# Patient Record
Sex: Female | Born: 1955
Health system: Southern US, Community
[De-identification: ages and names within clinical notes are randomized; demographics above are authoritative.]

## PROBLEM LIST (undated history)

## (undated) DIAGNOSIS — R519 Headache, unspecified: Secondary | ICD-10-CM

## (undated) DIAGNOSIS — R51 Headache: Secondary | ICD-10-CM

## (undated) DIAGNOSIS — Z801 Family history of malignant neoplasm of trachea, bronchus and lung: Secondary | ICD-10-CM

## (undated) DIAGNOSIS — Z803 Family history of malignant neoplasm of breast: Secondary | ICD-10-CM

## (undated) DIAGNOSIS — K219 Gastro-esophageal reflux disease without esophagitis: Secondary | ICD-10-CM

## (undated) DIAGNOSIS — Z8049 Family history of malignant neoplasm of other genital organs: Secondary | ICD-10-CM

## (undated) DIAGNOSIS — I10 Essential (primary) hypertension: Secondary | ICD-10-CM

## (undated) DIAGNOSIS — C801 Malignant (primary) neoplasm, unspecified: Secondary | ICD-10-CM

## (undated) HISTORY — DX: Essential (primary) hypertension: I10

## (undated) HISTORY — DX: Headache, unspecified: R51.9

## (undated) HISTORY — DX: Family history of malignant neoplasm of trachea, bronchus and lung: Z80.1

## (undated) HISTORY — DX: Family history of malignant neoplasm of breast: Z80.3

## (undated) HISTORY — DX: Malignant (primary) neoplasm, unspecified: C80.1

## (undated) HISTORY — DX: Gastro-esophageal reflux disease without esophagitis: K21.9

## (undated) HISTORY — DX: Headache: R51

## (undated) HISTORY — DX: Family history of malignant neoplasm of other genital organs: Z80.49

---

## 1999-06-28 ENCOUNTER — Other Ambulatory Visit: Admission: RE | Admit: 1999-06-28 | Discharge: 1999-06-28 | Payer: Self-pay | Admitting: *Deleted

## 2000-07-15 ENCOUNTER — Encounter: Admission: RE | Admit: 2000-07-15 | Discharge: 2000-07-15 | Payer: Self-pay | Admitting: *Deleted

## 2001-07-22 ENCOUNTER — Encounter: Admission: RE | Admit: 2001-07-22 | Discharge: 2001-07-22 | Payer: Self-pay | Admitting: Internal Medicine

## 2002-08-12 ENCOUNTER — Encounter: Admission: RE | Admit: 2002-08-12 | Discharge: 2002-08-12 | Payer: Self-pay | Admitting: *Deleted

## 2002-08-18 ENCOUNTER — Encounter: Admission: RE | Admit: 2002-08-18 | Discharge: 2002-08-18 | Payer: Self-pay | Admitting: *Deleted

## 2003-02-10 ENCOUNTER — Encounter: Admission: RE | Admit: 2003-02-10 | Discharge: 2003-02-10 | Payer: Self-pay | Admitting: *Deleted

## 2003-03-17 ENCOUNTER — Other Ambulatory Visit: Admission: RE | Admit: 2003-03-17 | Discharge: 2003-03-17 | Payer: Self-pay | Admitting: *Deleted

## 2003-08-22 ENCOUNTER — Encounter: Admission: RE | Admit: 2003-08-22 | Discharge: 2003-08-22 | Payer: Self-pay | Admitting: *Deleted

## 2004-04-09 ENCOUNTER — Other Ambulatory Visit: Admission: RE | Admit: 2004-04-09 | Discharge: 2004-04-09 | Payer: Self-pay | Admitting: *Deleted

## 2005-12-17 ENCOUNTER — Encounter: Admission: RE | Admit: 2005-12-17 | Discharge: 2005-12-17 | Payer: Self-pay | Admitting: Family Medicine

## 2007-02-19 ENCOUNTER — Encounter: Admission: RE | Admit: 2007-02-19 | Discharge: 2007-02-19 | Payer: Self-pay | Admitting: Family Medicine

## 2011-02-28 ENCOUNTER — Other Ambulatory Visit: Payer: Self-pay | Admitting: Family Medicine

## 2011-02-28 DIAGNOSIS — E041 Nontoxic single thyroid nodule: Secondary | ICD-10-CM

## 2011-03-01 ENCOUNTER — Ambulatory Visit
Admission: RE | Admit: 2011-03-01 | Discharge: 2011-03-01 | Disposition: A | Discharge status: Home / Self Care | Payer: BC Managed Care – PPO | Source: Ambulatory Visit | Attending: Family Medicine | Admitting: Family Medicine

## 2011-03-01 DIAGNOSIS — E041 Nontoxic single thyroid nodule: Secondary | ICD-10-CM

## 2011-03-04 ENCOUNTER — Other Ambulatory Visit: Payer: Self-pay | Admitting: Family Medicine

## 2011-03-04 DIAGNOSIS — E041 Nontoxic single thyroid nodule: Secondary | ICD-10-CM

## 2011-04-17 ENCOUNTER — Other Ambulatory Visit: Payer: Self-pay | Admitting: Endocrinology

## 2011-04-17 DIAGNOSIS — E042 Nontoxic multinodular goiter: Secondary | ICD-10-CM

## 2011-08-13 ENCOUNTER — Other Ambulatory Visit: Payer: BC Managed Care – PPO

## 2011-08-21 ENCOUNTER — Ambulatory Visit
Admission: RE | Admit: 2011-08-21 | Discharge: 2011-08-21 | Disposition: A | Discharge status: Home / Self Care | Payer: BC Managed Care – PPO | Source: Ambulatory Visit | Attending: Endocrinology | Admitting: Endocrinology

## 2011-08-21 DIAGNOSIS — E042 Nontoxic multinodular goiter: Secondary | ICD-10-CM

## 2014-01-12 ENCOUNTER — Other Ambulatory Visit: Payer: Self-pay | Admitting: Family Medicine

## 2014-01-12 ENCOUNTER — Other Ambulatory Visit (HOSPITAL_COMMUNITY)
Admission: RE | Admit: 2014-01-12 | Discharge: 2014-01-12 | Disposition: A | Discharge status: Home / Self Care | Payer: BC Managed Care – PPO | Source: Ambulatory Visit | Attending: Family Medicine | Admitting: Family Medicine

## 2014-01-12 DIAGNOSIS — Z1151 Encounter for screening for human papillomavirus (HPV): Secondary | ICD-10-CM | POA: Insufficient documentation

## 2014-01-12 DIAGNOSIS — Z124 Encounter for screening for malignant neoplasm of cervix: Secondary | ICD-10-CM | POA: Insufficient documentation

## 2014-01-12 DIAGNOSIS — E041 Nontoxic single thyroid nodule: Secondary | ICD-10-CM

## 2014-01-26 ENCOUNTER — Ambulatory Visit
Admission: RE | Admit: 2014-01-26 | Discharge: 2014-01-26 | Disposition: A | Discharge status: Home / Self Care | Payer: BC Managed Care – PPO | Source: Ambulatory Visit | Attending: Family Medicine | Admitting: Family Medicine

## 2014-01-26 DIAGNOSIS — E041 Nontoxic single thyroid nodule: Secondary | ICD-10-CM

## 2015-08-30 ENCOUNTER — Other Ambulatory Visit: Payer: Self-pay | Admitting: Family Medicine

## 2015-08-30 DIAGNOSIS — R109 Unspecified abdominal pain: Secondary | ICD-10-CM

## 2015-08-31 ENCOUNTER — Ambulatory Visit
Admission: RE | Admit: 2015-08-31 | Discharge: 2015-08-31 | Disposition: A | Discharge status: Home / Self Care | Payer: BLUE CROSS/BLUE SHIELD | Source: Ambulatory Visit | Attending: Family Medicine | Admitting: Family Medicine

## 2015-08-31 DIAGNOSIS — R109 Unspecified abdominal pain: Secondary | ICD-10-CM

## 2015-11-16 ENCOUNTER — Other Ambulatory Visit: Payer: Self-pay | Admitting: Family Medicine

## 2015-11-16 DIAGNOSIS — R102 Pelvic and perineal pain: Secondary | ICD-10-CM

## 2015-11-20 ENCOUNTER — Ambulatory Visit
Admission: RE | Admit: 2015-11-20 | Discharge: 2015-11-20 | Disposition: A | Discharge status: Home / Self Care | Payer: Managed Care, Other (non HMO) | Source: Ambulatory Visit | Attending: Family Medicine | Admitting: Family Medicine

## 2015-11-20 DIAGNOSIS — R102 Pelvic and perineal pain: Secondary | ICD-10-CM

## 2015-11-20 MED ORDER — GADOBENATE DIMEGLUMINE 529 MG/ML IV SOLN
15.0000 mL | Freq: Once | INTRAVENOUS | Status: AC | PRN
  Administered 2015-11-20: 15 mL via INTRAVENOUS

## 2016-09-24 ENCOUNTER — Ambulatory Visit (INDEPENDENT_AMBULATORY_CARE_PROVIDER_SITE_OTHER): Payer: BLUE CROSS/BLUE SHIELD | Admitting: Sports Medicine

## 2016-09-24 DIAGNOSIS — M2170 Unequal limb length (acquired), unspecified site: Secondary | ICD-10-CM

## 2016-09-24 DIAGNOSIS — M722 Plantar fascial fibromatosis: Secondary | ICD-10-CM

## 2016-09-24 DIAGNOSIS — M25579 Pain in unspecified ankle and joints of unspecified foot: Secondary | ICD-10-CM | POA: Diagnosis not present

## 2016-09-24 NOTE — Progress Notes (Signed)
   Subjective:  Patient ID: Angel Gaines, female    DOB: August 08, 1956  Age: 60 y.o. MRN: [id number]  CC: Bilateral Foot Pain  HPI Angel Gaines is a 60 year old female with a PMH significant for plantar fasciitis, who presents today with bilateral foot pain. She has had a history of plantar fasciitis for over two years now. She got rigid orthotics to help with her plantar fasciitis and vionic sandals. She reports that she wears the vionic sandals most of the year and only wears her insert with sneakers when she needs to walk and during the winter time. Patient reports that right now, while both feet are bothering her, patient reports that her left foot pain is much worse than her right. She reports that the pain is worse in the morning and gets better throughout the day, but is worsened with activity. Patient denies swelling or numbness.   Exercise pattern - likes to walk 3 miles 4 x per week Also walks dog  Past Med Hx - no active problems  ROS Review of Systems  Musculoskeletal:       Bilateral Foot Pain  Refer to HPI for pertinent negatives and positives   Objective:  Today's Vitals: BP (!) 142/85   Ht 5\' 3"  (1.6 m)   Wt 160 lb (72.6 kg)   BMI 28.34 kg/m   Physical Exam  Constitutional: She appears well-developed and well-nourished.  Musculoskeletal:  Right Foot: No lesions, deformities, or swelling. Mild loss of longitudinal arch. No point tenderness. Normal ROM. Neurovascularly intact.  Left Foot:  Resting pronation. Piezogenic papules present on medial heel. Fallen arch. Calcaneal valgus. 2-4 MTP shifted laterally.  Normal ROM. Neurovascularly intact.  Gait: Pronation of the left foot when walking w/o orthotics. 1 cm leg length discrepancy w/ left leg shorter.     Assessment & Plan:   1. Bilateral Foot Pain Secondary to Plantar Fasciitis Patient is currently using rigid orthotics, which corrects her pronation in her left foot, but expresses discomfort. Recommend she  tried cushioned orthotics. Provided patient with a pair of cushioned sports insoles Added scaphoid pads bilaterally and a heel raise to the left insert/. Patient felt they were comfortable. Pronation corrected when walking with them.  Will follow up in 1 month to see how patient likes them and make readjustments at that time.   Can switch to softer custom orthotic if needed  Follow-up: 1 month   Salvadore Dom, Medical Student  I observed and examined the patient with the student and agree with assessment and plan.  Note reviewed and modified by me.  Ila Mcgill, MD

## 2016-09-24 NOTE — Assessment & Plan Note (Signed)
Standard exercises  Try sports insoles  Icing  Consider cushioned custom orthotic if needed

## 2016-09-24 NOTE — Assessment & Plan Note (Signed)
Correction to left sports insole for lift

## 2017-02-03 ENCOUNTER — Ambulatory Visit (INDEPENDENT_AMBULATORY_CARE_PROVIDER_SITE_OTHER): Payer: BLUE CROSS/BLUE SHIELD | Admitting: Neurology

## 2017-02-03 ENCOUNTER — Encounter: Payer: Self-pay | Admitting: Neurology

## 2017-02-03 DIAGNOSIS — R51 Headache: Secondary | ICD-10-CM | POA: Diagnosis not present

## 2017-02-03 DIAGNOSIS — H9193 Unspecified hearing loss, bilateral: Secondary | ICD-10-CM

## 2017-02-03 DIAGNOSIS — H9190 Unspecified hearing loss, unspecified ear: Secondary | ICD-10-CM | POA: Insufficient documentation

## 2017-02-03 DIAGNOSIS — R519 Headache, unspecified: Secondary | ICD-10-CM | POA: Insufficient documentation

## 2017-02-03 MED ORDER — DIAZEPAM 5 MG PO TABS: ORAL_TABLET | ORAL | 0 refills | Status: DC

## 2017-02-03 NOTE — Progress Notes (Signed)
PATIENT: Angel Gaines DOB: 01/29/1956  Chief Complaint  Patient presents with  . Pressure in head    She has been having intermittent pressure, not pain, in her head for the last year.  Also, reports three isolated incidents of dizziness that lasted less than a minute.  These incidents occurred while was she sitting and they were unrelated to postional changes.   Marland Kitchen PCP    Donald Prose, MD  . ENT    Izora Gala, MD - referring MD     HISTORICAL  Angel Gaines is a 61 years old right-handed female, seen in refer by ENT Dr.  Izora Gala for evaluation of heaviness in her head, her primary care physician is Dr. Donald Prose.  initial evaluation was on February 03 2017  She was previously healthy, since 2017, she began to noticed pressure sensation in her head, worse time was in the morning, she felt her head is heavy, it is hard for her neck to support her head, she often has to use her arm to support her head, she denies double vision, no muscle weakness, no visual loss, her symptoms was in the morning, gradually gets better by evening time, is also no obvious when she was standing  Over the past 1 year, her symptoms has been persistent, there was no significant worsening, but over the past few years, she reported episode of sudden onset inside dizziness sensation in her head, no visual vertigo, no nausea, no loss of consciousness, lasting for 30 seconds, there was no significant consequence after the event,  She reported left ear pressure sensation since her air plane trip in 2007, it is difficult to pop left tympanic membrane, recent ENT examination showed no significant abnormality, auditory test showed high frequency 8000Hz  not hearing loss. She has occasionally left ear tinnitus  she was noted to have narrow oropharyngeal, has frequent snoring, daytime fatigue   REVIEW OF SYSTEMS: Full 14 system review of systems performed and notable only for  above  as above ALLERGIES: Not on  File  HOME MEDICATIONS: Current Outpatient Prescriptions  Medication Sig Dispense Refill  . aspirin EC 81 MG tablet Take 81 mg by mouth daily.    . Multiple Vitamin (MULTIVITAMIN) tablet Take 1 tablet by mouth daily.     No current facility-administered medications for this visit.     PAST MEDICAL HISTORY: Past Medical History:  Diagnosis Date  . Pressure in head     PAST SURGICAL HISTORY: History reviewed. No pertinent surgical history.  FAMILY HISTORY: Family History  Problem Relation Age of Onset  . Sarcoidosis Mother   . Lung cancer Father     SOCIAL HISTORY:  Social History   Social History  . Marital status: Single    Spouse name: N/A  . Number of children: 1  . Years of education: Masters   Occupational History  . IT Director    Social History Main Topics  . Smoking status: Never Smoker  . Smokeless tobacco: Never Used  . Alcohol use Yes     Comment: 2 glasses of wine weekly  . Drug use: No  . Sexual activity: Not on file   Other Topics Concern  . Not on file   Social History Narrative   Lives at home with one adopted daughter.   1-2 cups caffeine daily.   Right-handed.     PHYSICAL EXAM   Vitals:   02/03/17 0728  BP: 136/87  Pulse: 72  Weight: 166  lb (75.3 kg)  Height: 5' 3"  (1.6 m)    Not recorded      Body mass index is 29.41 kg/m.  PHYSICAL EXAMNIATION:  Gen: NAD, conversant, well nourised, obese, well groomed                     Cardiovascular: Regular rate rhythm, no peripheral edema, warm, nontender. Eyes: Conjunctivae clear without exudates or hemorrhage Neck: Supple, no carotid bruits. Pulmonary: Clear to auscultation bilaterally   NEUROLOGICAL EXAM:  MENTAL STATUS: Speech:    Speech is normal; fluent and spontaneous with normal comprehension.  Cognition:     Orientation to time, place and person     Normal recent and remote memory     Normal Attention span and concentration     Normal Language, naming,  repeating,spontaneous speech     Fund of knowledge   CRANIAL NERVES: CN II: Visual fields are full to confrontation. Fundoscopic exam is normal with sharp discs and no vascular changes. Pupils are round equal and briskly reactive to light. CN III, IV, VI: extraocular movement are normal. No ptosis. CN V: Facial sensation is intact to pinprick in all 3 divisions bilaterally. Corneal responses are intact.  CN VII: Face is symmetric with normal eye closure and smile. CN VIII: Hearing is normal to rubbing fingers CN IX, X: Palate elevates symmetrically. Phonation is normal. CN XI: Head turning and shoulder shrug are intact CN XII: Tongue is midline with normal movements and no atrophy.  MOTOR: There is no pronator drift of out-stretched arms. Muscle bulk and tone are normal. Muscle strength is normal.  REFLEXES: Reflexes are 2+ and symmetric at the biceps, triceps, knees, and ankles. Plantar responses are flexor.  SENSORY: Intact to light touch, pinprick, positional sensation and vibratory sensation are intact in fingers and toes.  COORDINATION: Rapid alternating movements and fine finger movements are intact. There is no dysmetria on finger-to-nose and heel-knee-shin.    GAIT/STANCE: Posture is normal. Gait is steady with normal steps, base, arm swing, and turning. Heel and toe walking are normal. Tandem gait is normal.  Romberg is absent.   DIAGNOSTIC DATA (LABS, IMAGING, TESTING) - I reviewed patient records, labs, notes, testing and imaging myself where available.   ASSESSMENT AND PLAN  Angel Gaines is a 61 y.o. female   New-onset head pressure   ESR C-reactive protein to rule out inflammatory process  MRI of the brain without contrast Probable obstructive sleep apnea  Refer her to sleep study     Marcial Pacas, M.D. Ph.D.  [facility] [address], [place], [place] [address] Ph: [phone] Fax: [phone]  CC: Izora Gala, MD  Donald Prose, MD

## 2017-02-04 ENCOUNTER — Telehealth: Payer: Self-pay | Admitting: Neurology

## 2017-02-04 NOTE — Telephone Encounter (Signed)
I called the patient back and scheduled her MRI for 02/26/17 at our [place] mobile unit.

## 2017-02-04 NOTE — Telephone Encounter (Signed)
Pt returned Emily's call °

## 2017-02-24 ENCOUNTER — Telehealth: Payer: Self-pay | Admitting: *Deleted

## 2017-02-24 NOTE — Telephone Encounter (Signed)
Labs received from [facility] (collected 02/19/17):  CBC w/ DIFF: wnl  CMP: wnl  CRP: wnl (1.3)  HGB A1C: wnl (5.6%)  LIPID PANEL w/ REFLEX: Cholesterol: 252 H LDLc: 153 H NHDL: 173 H All other values wnl  SED RATE: wnl (1)  TSH: wnl (3.87)  VITAMIN D: 23.5 L

## 2017-02-26 ENCOUNTER — Ambulatory Visit (INDEPENDENT_AMBULATORY_CARE_PROVIDER_SITE_OTHER): Payer: BLUE CROSS/BLUE SHIELD

## 2017-02-26 DIAGNOSIS — H9193 Unspecified hearing loss, bilateral: Secondary | ICD-10-CM | POA: Diagnosis not present

## 2017-02-26 DIAGNOSIS — R51 Headache: Secondary | ICD-10-CM | POA: Diagnosis not present

## 2017-02-26 DIAGNOSIS — R519 Headache, unspecified: Secondary | ICD-10-CM

## 2017-02-27 ENCOUNTER — Ambulatory Visit (INDEPENDENT_AMBULATORY_CARE_PROVIDER_SITE_OTHER): Payer: BLUE CROSS/BLUE SHIELD | Admitting: Neurology

## 2017-02-27 ENCOUNTER — Encounter: Payer: Self-pay | Admitting: Neurology

## 2017-02-27 VITALS — BP 147/90 | HR 77 | Resp 20 | Ht 62.0 in | Wt 168.0 lb

## 2017-02-27 DIAGNOSIS — G4719 Other hypersomnia: Secondary | ICD-10-CM

## 2017-02-27 DIAGNOSIS — R0683 Snoring: Secondary | ICD-10-CM | POA: Diagnosis not present

## 2017-02-27 NOTE — Progress Notes (Signed)
SLEEP MEDICINE CLINIC   Provider:  Larey Gaines, M D  Referring Provider: Donald Prose, MD Primary Care Physician:  Angel Logan, MD  Chief Complaint  Patient presents with  . New Patient (Initial Visit)    never had a sleep study, snores    HPI:  Angel Gaines is a 61 y.o. female , seen here as a referral  from Dr. Krista Gaines for a sleep consultation.    Chief complaint according to patient : She has also noted that she is more daytime sleepy, she has used her snoring out for her smart phone that indicated that she is a moderate snorer. This app measures noise  and has some flaws as to what it may record. Her daughter reports her snoring. She had seen Dr. Krista Gaines for feeling of intermittent head pressure, not pain. Described as a feeling of fullness as if her "head is heavy " needs support. She feels that her deepest sleep his right after midnight, and that seems to be the time when her daughter has witnessed her to snore    Sleep habits are as follows: Mrs. Angel Gaines states that she goes to bed usually  late -often around midnight or later. Her 57 year old daughter has similar late sleep habits and she often stays up with her, she may already fall asleep on the sofa before she finally decides to transfer to the bedroom. And usually this time is past midnight. She describes the bedroom is cool, quiet and dark, she usually goes to sleep on her side, she uses a wedge and additional pillows for sleep comfortable position. She usually has one bathroom break at night. This may be around 2 AM and has not always been easy for her to her back to sleep. She spontaneously wakes up between 6 and 7 AM has an alarm for background security, and has the same wake up time on weekdays as on weekends. She usually feels refreshed and restored in the morning. She does not feel that she has a dry mouth in the morning, she does not have headaches or the feeling of fullness at the time she wakes up.    Sleep  medical history and family sleep history:   Social history: On weekends she may drink one rarely 2 glasses of wine with dinner, during the week usually none. She is a nonsmoker non-tobacco user, has no passive smoke exposure. She drinks 2 cups of caffeinated coffee in the morning at work she will have a cup of green tea she does not consume sodas and she does not consume iced tea. She has daytime employment, no shift work history. Her office has daylight exposure , she is commuting to [place] and back to work for many years.   Review of Systems: Out of a complete 14 system review, the patient complains of only the following symptoms, and all other reviewed systems are negative. How likely are you to doze in the following situations: 0 = not likely, 1 = slight chance, 2 = moderate chance, 3 = high chance  Sitting and Reading? 3 Watching Television? 3 Sitting inactive in a public place (theater or meeting)?1 As a passenger in a car for an hour without a break?1  Lying down in the afternoon when circumstances permit?3 Sitting and talking to someone?0 Sitting quietly after lunch without alcohol?1 In a car, while stopped for a few minutes in traffic?0  Total = 12    Epworth score  , Fatigue severity score 16  , depression  score n/a    Social History   Social History  . Marital status: Single    Spouse name: N/A  . Number of children: 1  . Years of education: Masters   Occupational History  . IT Director    Social History Main Topics  . Smoking status: Never Smoker  . Smokeless tobacco: Never Used  . Alcohol use Yes     Comment: 2 glasses of wine weekly  . Drug use: No  . Sexual activity: Not on file   Other Topics Concern  . Not on file   Social History Narrative   Lives at home with one adopted daughter.   1-2 cups caffeine daily.   Right-handed.    Family History  Problem Relation Age of Onset  . Sarcoidosis Mother   . Lung cancer Father     Past Medical  History:  Diagnosis Date  . Pressure in head     No past surgical history on file.  Current Outpatient Prescriptions  Medication Sig Dispense Refill  . aspirin EC 81 MG tablet Take 81 mg by mouth daily.    . Cholecalciferol (VITAMIN D) 2000 units CAPS Take 2,000 Units by mouth daily.    . diazepam (VALIUM) 5 MG tablet As needed for MRI 2 tablet 0  . Multiple Vitamin (MULTIVITAMIN) tablet Take 1 tablet by mouth daily.    . Omega-3 Fatty Acids (FISH OIL PO) Take by mouth.     No current facility-administered medications for this visit.     Allergies as of 02/27/2017  . (No Known Allergies)    Vitals: BP (!) 147/90   Pulse 77   Resp 20   Ht 5\' 2"  (1.575 m)   Wt 168 lb (76.2 kg)   BMI 30.73 kg/m  Last Weight:  Wt Readings from Last 1 Encounters:  02/27/17 168 lb (76.2 kg)   EXB:MWUX mass index is 30.73 kg/m.     Last Height:   Ht Readings from Last 1 Encounters:  02/27/17 5\' 2"  (1.575 m)    Physical exam:  General: The patient is awake, alert and appears not in acute distress. The patient is well groomed. Head: Normocephalic, atraumatic. Neck is supple. Mallampati 4,  neck circumference:13. 5. Nasal airflow patent . Retrognathia is mild - used to wear retainers/ braces .  Cardiovascular:  Regular rate and rhythm, without  murmurs or carotid bruit, and without distended neck veins. Respiratory: Lungs are clear to auscultation. Skin:  Without evidence of edema, or rash Trunk: BMI is 31.   Neurologic exam : The patient is awake and alert, oriented to place and time.   Attention span & concentration ability appears normal.  Speech is fluent,  without dysarthria, dysphonia or aphasia.  Mood and affect are appropriate.  Cranial nerves: Pupils are equal and briskly reactive to light.  Extraocular movements  in vertical and horizontal planes intact and without nystagmus. Visual fields by finger perimetry are intact.  Facial motor strength is symmetric and tongue and  uvula move midline. Shoulder shrug was symmetrical.   Motor exam: Normal tone, muscle bulk and symmetric strength in all extremities. Sensory:  Fine touch, pinprick and vibration, Proprioception tested in the upper extremities was normal. Finger-to-nose maneuver  normal without evidence of ataxia, dysmetria or tremor. Gait and station: Patient walks without assistive device and is able unassisted to climb up to the exam table. Strength within normal limits.  Stance is stable and normal.  Deep tendon reflexes: in the  upper  and lower extremities are symmetric and intact. Babinski maneuver response is downgoing.  The patient was advised of the nature of the diagnosed sleep disorder ( snoring) , the treatment options and risks for general a health and wellness arising from not treating the condition.  I spent more than 30 minutes of face to face time with the patient. Greater than 50% of time was spent in counseling and coordination of care. We have discussed the diagnosis and differential and I answered the patient's questions.     Assessment:  After physical and neurologic examination, review of  Dr Greer Pickerel studies and visit notes, my assessment is   1) Mrs. Lightsey has physical markers that make her likely to snore including mild retrognathia, and a low hanging uvula. She does not have macroglossia, her neck circumference is normal her BMI is elevated but she is not obese. If apnea is present I cannot predict. What I would like her to do is to undergo a home sleep test to see if her snoring correlates with apnea and we will indirectly surmises by correlating heart rate at night and pulse oximetry.  Plan:  Treatment plan and additional workup : HST and RV if positive for apnea.     Asencion Partridge Talor Cheema MD  02/27/2017   CC: Angel Gaines, [place], [place] [address]

## 2017-03-12 ENCOUNTER — Ambulatory Visit (INDEPENDENT_AMBULATORY_CARE_PROVIDER_SITE_OTHER): Payer: BLUE CROSS/BLUE SHIELD | Admitting: Neurology

## 2017-03-12 DIAGNOSIS — G4719 Other hypersomnia: Secondary | ICD-10-CM

## 2017-03-12 DIAGNOSIS — G4710 Hypersomnia, unspecified: Secondary | ICD-10-CM | POA: Diagnosis not present

## 2017-03-12 DIAGNOSIS — R0683 Snoring: Secondary | ICD-10-CM

## 2017-03-18 NOTE — Procedures (Signed)
NAME: Angel Gaines                     DOB: 1956/03/13 MEDICAL RECORD [id number]          DOS: 03/12/17 REFERRING PHYSICIAN: Donald Prose, MD Study performed: HST/Out of Center Sleep test HISTORY: Angel Gaines is a 61 year old female, seen here as a referral from Dr. Krista Blue for snoring. Her daughter reports her snoring. She had seen Dr. Krista Blue for feeling of intermittent head pressure, not pain. She feels that her deepest sleep his right after midnight, and that seems to be the time when her daughter has witnessed her to snor    Epworth Sleepiness score at 12 , Fatigue severity score endorsed at 16 points ,BMI is 31.   STUDY RESULTS: Total Recording Time: 6h 40m Total Apnea/Hypopnea Index (AHI) 0.2 / hr. Average Oxygen Saturation: SpO2 at  95% Lowest Oxygen Saturation: SpO2 nadir at 92%, duration; o minutes, Average Heart Rate: 60 bpm  IMPRESSION: neither apnea nor hypoxemia is present.  Snoring in such cases can be treated by a dental device. Also helpful are weight loss and avoiding supine sleep.  RECOMMENDATION: We can offer a referral to a dental specialist. A follow up in the sleep clinic is not needed. I certify that I have reviewed the raw data recording prior to the issuance of this report in accordance with the standards of Accreditation of the American Academy of Sleep medicine (AASM) Larey Seat, MD                03-18-2017  Diplomat, American Board of Psychiatry and Neurology Diplomat, American Board of Sleep Medicine Medical Director of [facility] [employer]

## 2017-03-20 ENCOUNTER — Telehealth: Payer: Self-pay

## 2017-03-20 NOTE — Telephone Encounter (Signed)
Patient called back and I was able to give results and recommendations. She voiced understanding. Patient declined referral for dental device. She will f/u with Dr. Krista Blue as planned. Report sent to Dr. Krista Blue.

## 2017-03-20 NOTE — Telephone Encounter (Signed)
-----   Message from Larey Seat, MD sent at 03/18/2017  4:32 PM EDT ----- STUDY RESULTS: Total Recording Time: 6h 39m Total Apnea/Hypopnea Index (AHI) 0.2 / hr. Average Oxygen Saturation: SpO2 at  95% Lowest Oxygen Saturation: SpO2 nadir at 92%, hypoxia duration of 0.0 minutes, Average Heart Rate: 60 bpm  IMPRESSION: neither apnea nor hypoxemia is present.  Snoring in such cases can be treated by a dental device. Also helpful are weight loss and avoiding supine sleep.  RECOMMENDATION: We can offer a referral to a dental specialist. Please call. A follow up in the sleep clinic is not needed.

## 2017-03-20 NOTE — Telephone Encounter (Signed)
LM on both numbers for patient to call back.

## 2017-05-07 ENCOUNTER — Encounter (INDEPENDENT_AMBULATORY_CARE_PROVIDER_SITE_OTHER): Payer: Self-pay

## 2017-05-07 ENCOUNTER — Ambulatory Visit (INDEPENDENT_AMBULATORY_CARE_PROVIDER_SITE_OTHER): Payer: BLUE CROSS/BLUE SHIELD | Admitting: Neurology

## 2017-05-07 ENCOUNTER — Encounter: Payer: Self-pay | Admitting: Neurology

## 2017-05-07 VITALS — BP 145/88 | HR 74 | Ht 62.0 in | Wt 171.0 lb

## 2017-05-07 DIAGNOSIS — R519 Headache, unspecified: Secondary | ICD-10-CM

## 2017-05-07 DIAGNOSIS — R51 Headache: Secondary | ICD-10-CM | POA: Diagnosis not present

## 2017-05-07 NOTE — Progress Notes (Signed)
PATIENT: Angel Gaines DOB: 1956/05/17  Chief Complaint  Patient presents with  . Morning Head Pressure    She still wakes up with intermittent pressure in her head but feels the frequency has improved.  . Sleep Problems    Her sleep study was negative for OSA.  She declined referral for dental device.     HISTORICAL  Angel Gaines is a 61 years old right-handed female, seen in refer by ENT Dr.  Izora Gala for evaluation of heaviness in her head, her primary care physician is Dr. Donald Prose.  initial evaluation was on February 03 2017  She was previously healthy, since 2017, she began to noticed pressure sensation in her head, worse time was in the morning, she felt her head is heavy, it is hard for her neck to support her head, she often has to use her arm to support her head, she denies double vision, no muscle weakness, no visual loss, her symptoms was in the morning, gradually gets better by evening time, is also no obvious when she was standing  Over the past 1 year, her symptoms has been persistent, there was no significant worsening, but over the past few years, she reported episode of sudden onset inside dizziness sensation in her head, no visual vertigo, no nausea, no loss of consciousness, lasting for 30 seconds, there was no significant consequence after the event,  She reported left ear pressure sensation since her air plane trip in 2007, it is difficult to pop left tympanic membrane, recent ENT examination showed no significant abnormality, auditory test showed high frequency _0  not hearing loss. She has occasionally left ear tinnitus  she was noted to have narrow oropharyngeal, has frequent snoring, daytime fatigue   UPDATE May 07 2017: She reported normal home sleep study,  I personally reviewed MRI of the brain on February 26 2017 that was normal, laboratory showed normal ESR. C-reactive protein,  She still has intermittent light headed, occasionally pounding  pressure sensation, mostly in the morning time,   REVIEW OF SYSTEMS: Full 14 system review of systems performed and notable only for  above  as above ALLERGIES: No Known Allergies  HOME MEDICATIONS: Current Outpatient Prescriptions  Medication Sig Dispense Refill  . aspirin EC 81 MG tablet Take 81 mg by mouth daily.    . Cholecalciferol (VITAMIN D) 2000 units CAPS Take 2,000 Units by mouth daily.    . Multiple Vitamin (MULTIVITAMIN) tablet Take 1 tablet by mouth daily.    . Omega-3 Fatty Acids (FISH OIL PO) Take by mouth.     No current facility-administered medications for this visit.     PAST MEDICAL HISTORY: Past Medical History:  Diagnosis Date  . Pressure in head     PAST SURGICAL HISTORY: No past surgical history on file.  FAMILY HISTORY: Family History  Problem Relation Age of Onset  . Sarcoidosis Mother   . Lung cancer Father     SOCIAL HISTORY:  Social History   Social History  . Marital status: Single    Spouse name: N/A  . Number of children: 1  . Years of education: Masters   Occupational History  . IT Director    Social History Main Topics  . Smoking status: Never Smoker  . Smokeless tobacco: Never Used  . Alcohol use Yes     Comment: 2 glasses of wine weekly  . Drug use: No  . Sexual activity: Not on file   Other Topics Concern  .  Not on file   Social History Narrative   Lives at home with one adopted daughter.   1-2 cups caffeine daily.   Right-handed.     PHYSICAL EXAM   Vitals:   05/07/17 0830  BP: (!) 145/88  Pulse: 74  Weight: 171 lb (77.6 kg)  Height: _0  (1.575 m)    Not recorded      Body mass index is 31.28 kg/m.  PHYSICAL EXAMNIATION:  Gen: NAD, conversant, well nourised, obese, well groomed                     Cardiovascular: Regular rate rhythm, no peripheral edema, warm, nontender. Eyes: Conjunctivae clear without exudates or hemorrhage Neck: Supple, no carotid bruits. Pulmonary: Clear to auscultation  bilaterally   NEUROLOGICAL EXAM:  MENTAL STATUS: Speech:    Speech is normal; fluent and spontaneous with normal comprehension.  Cognition:     Orientation to time, place and person     Normal recent and remote memory     Normal Attention span and concentration     Normal Language, naming, repeating,spontaneous speech     Fund of knowledge   CRANIAL NERVES: CN II: Visual fields are full to confrontation. Fundoscopic exam is normal with sharp discs and no vascular changes. Pupils are round equal and briskly reactive to light. CN III, IV, VI: extraocular movement are normal. No ptosis. CN V: Facial sensation is intact to pinprick in all 3 divisions bilaterally. Corneal responses are intact.  CN VII: Face is symmetric with normal eye closure and smile. CN VIII: Hearing is normal to rubbing fingers CN IX, X: Palate elevates symmetrically. Phonation is normal. CN XI: Head turning and shoulder shrug are intact CN XII: Tongue is midline with normal movements and no atrophy.  MOTOR: There is no pronator drift of out-stretched arms. Muscle bulk and tone are normal. Muscle strength is normal.  REFLEXES: Reflexes are 2+ and symmetric at the biceps, triceps, knees, and ankles. Plantar responses are flexor.  SENSORY: Intact to light touch, pinprick, positional sensation and vibratory sensation are intact in fingers and toes.  COORDINATION: Rapid alternating movements and fine finger movements are intact. There is no dysmetria on finger-to-nose and heel-knee-shin.    GAIT/STANCE: Posture is normal. Gait is steady with normal steps, base, arm swing, and turning. Heel and toe walking are normal. Tandem gait is normal.  Romberg is absent.   DIAGNOSTIC DATA (LABS, IMAGING, TESTING) - I reviewed patient records, labs, notes, testing and imaging myself where available.   ASSESSMENT AND PLAN  Angel Gaines is a 60 y.o. female   New-onset morning head pressure   Normal ESR C-reactive  protein  Normal MRI of the brain without contrast  Normal sleep study no evidence of obstructive sleep apnea  Emphasize important of healthy lifestyle, may consider as needed NSAIDs for headache     Marcial Pacas, M.D. Ph.D.  [facility] [address], [place], [place] [address] Ph: [phone] Fax: [phone]  CC: Izora Gala, MD Donald Prose, MD

## 2018-05-11 DIAGNOSIS — Z1231 Encounter for screening mammogram for malignant neoplasm of breast: Secondary | ICD-10-CM | POA: Diagnosis not present

## 2018-05-15 DIAGNOSIS — R921 Mammographic calcification found on diagnostic imaging of breast: Secondary | ICD-10-CM | POA: Diagnosis not present

## 2018-09-14 ENCOUNTER — Other Ambulatory Visit: Payer: Self-pay | Admitting: Family Medicine

## 2018-09-14 ENCOUNTER — Ambulatory Visit
Admission: RE | Admit: 2018-09-14 | Discharge: 2018-09-14 | Disposition: A | Discharge status: Home / Self Care | Payer: 59 | Source: Ambulatory Visit | Attending: Family Medicine | Admitting: Family Medicine

## 2018-09-14 DIAGNOSIS — M79652 Pain in left thigh: Secondary | ICD-10-CM | POA: Diagnosis not present

## 2018-09-14 DIAGNOSIS — S7012XA Contusion of left thigh, initial encounter: Secondary | ICD-10-CM | POA: Diagnosis not present

## 2018-10-07 DIAGNOSIS — G5722 Lesion of femoral nerve, left lower limb: Secondary | ICD-10-CM | POA: Diagnosis not present

## 2018-10-07 DIAGNOSIS — M799 Soft tissue disorder, unspecified: Secondary | ICD-10-CM | POA: Diagnosis not present

## 2018-10-07 DIAGNOSIS — M62552 Muscle wasting and atrophy, not elsewhere classified, left thigh: Secondary | ICD-10-CM | POA: Diagnosis not present

## 2018-10-13 DIAGNOSIS — G5722 Lesion of femoral nerve, left lower limb: Secondary | ICD-10-CM | POA: Diagnosis not present

## 2018-10-13 DIAGNOSIS — M799 Soft tissue disorder, unspecified: Secondary | ICD-10-CM | POA: Diagnosis not present

## 2018-10-13 DIAGNOSIS — M62552 Muscle wasting and atrophy, not elsewhere classified, left thigh: Secondary | ICD-10-CM | POA: Diagnosis not present

## 2018-10-20 DIAGNOSIS — M62552 Muscle wasting and atrophy, not elsewhere classified, left thigh: Secondary | ICD-10-CM | POA: Diagnosis not present

## 2018-10-20 DIAGNOSIS — M799 Soft tissue disorder, unspecified: Secondary | ICD-10-CM | POA: Diagnosis not present

## 2018-10-20 DIAGNOSIS — G5722 Lesion of femoral nerve, left lower limb: Secondary | ICD-10-CM | POA: Diagnosis not present

## 2018-10-27 DIAGNOSIS — M62552 Muscle wasting and atrophy, not elsewhere classified, left thigh: Secondary | ICD-10-CM | POA: Diagnosis not present

## 2018-10-27 DIAGNOSIS — M799 Soft tissue disorder, unspecified: Secondary | ICD-10-CM | POA: Diagnosis not present

## 2018-10-27 DIAGNOSIS — G5722 Lesion of femoral nerve, left lower limb: Secondary | ICD-10-CM | POA: Diagnosis not present

## 2018-11-17 DIAGNOSIS — M62552 Muscle wasting and atrophy, not elsewhere classified, left thigh: Secondary | ICD-10-CM | POA: Diagnosis not present

## 2018-11-17 DIAGNOSIS — R921 Mammographic calcification found on diagnostic imaging of breast: Secondary | ICD-10-CM | POA: Diagnosis not present

## 2018-11-17 DIAGNOSIS — M799 Soft tissue disorder, unspecified: Secondary | ICD-10-CM | POA: Diagnosis not present

## 2018-11-17 DIAGNOSIS — G5722 Lesion of femoral nerve, left lower limb: Secondary | ICD-10-CM | POA: Diagnosis not present

## 2018-12-09 HISTORY — PX: BREAST LUMPECTOMY: SHX2

## 2019-04-30 ENCOUNTER — Other Ambulatory Visit (HOSPITAL_COMMUNITY)
Admission: RE | Admit: 2019-04-30 | Discharge: 2019-04-30 | Disposition: A | Discharge status: Home / Self Care | Payer: 59 | Source: Ambulatory Visit | Attending: Family Medicine | Admitting: Family Medicine

## 2019-04-30 ENCOUNTER — Other Ambulatory Visit: Payer: Self-pay | Admitting: Family Medicine

## 2019-04-30 DIAGNOSIS — Z0000 Encounter for general adult medical examination without abnormal findings: Secondary | ICD-10-CM | POA: Insufficient documentation

## 2019-04-30 DIAGNOSIS — Z1159 Encounter for screening for other viral diseases: Secondary | ICD-10-CM | POA: Diagnosis not present

## 2019-04-30 DIAGNOSIS — Z23 Encounter for immunization: Secondary | ICD-10-CM | POA: Diagnosis not present

## 2019-04-30 DIAGNOSIS — Z13220 Encounter for screening for lipoid disorders: Secondary | ICD-10-CM | POA: Diagnosis not present

## 2019-05-06 LAB — CYTOLOGY - PAP
Diagnosis: NEGATIVE
HPV: NOT DETECTED

## 2019-05-19 ENCOUNTER — Other Ambulatory Visit: Payer: Self-pay | Admitting: Radiology

## 2019-05-26 ENCOUNTER — Encounter: Payer: Self-pay | Admitting: Adult Health

## 2019-05-26 DIAGNOSIS — C50412 Malignant neoplasm of upper-outer quadrant of left female breast: Secondary | ICD-10-CM | POA: Insufficient documentation

## 2019-05-26 DIAGNOSIS — Z170 Estrogen receptor positive status [ER+]: Secondary | ICD-10-CM | POA: Insufficient documentation

## 2019-05-27 ENCOUNTER — Other Ambulatory Visit: Payer: Self-pay | Admitting: General Surgery

## 2019-05-27 DIAGNOSIS — Z170 Estrogen receptor positive status [ER+]: Secondary | ICD-10-CM

## 2019-05-27 DIAGNOSIS — C50412 Malignant neoplasm of upper-outer quadrant of left female breast: Secondary | ICD-10-CM

## 2019-05-28 ENCOUNTER — Telehealth: Payer: Self-pay | Admitting: Radiation Oncology

## 2019-05-28 NOTE — Telephone Encounter (Signed)
New Message:    LVM for patient to return call to schedule from referral received from Dr. Barry Dienes to see Dr. Kyung Rudd

## 2019-06-01 ENCOUNTER — Ambulatory Visit
Admission: RE | Admit: 2019-06-01 | Discharge: 2019-06-01 | Disposition: A | Discharge status: Home / Self Care | Payer: 59 | Source: Ambulatory Visit | Attending: Radiation Oncology | Admitting: Radiation Oncology

## 2019-06-01 ENCOUNTER — Encounter: Payer: Self-pay | Admitting: Radiation Oncology

## 2019-06-01 ENCOUNTER — Telehealth: Payer: Self-pay | Admitting: Oncology

## 2019-06-01 ENCOUNTER — Other Ambulatory Visit: Payer: Self-pay

## 2019-06-01 VITALS — Ht 62.0 in | Wt 150.0 lb

## 2019-06-01 DIAGNOSIS — C50412 Malignant neoplasm of upper-outer quadrant of left female breast: Secondary | ICD-10-CM

## 2019-06-01 NOTE — Telephone Encounter (Signed)
Received a new patient appt from Dr. Barry Dienes for breast cancer. Angel Gaines has been cld and scheduled to see Dr. Jana Hakim on 6/30 at 430pm. Aware to arrive 15 minutes early.

## 2019-06-01 NOTE — Progress Notes (Signed)
Radiation Oncology         ([phone]) [phone] ________________________________  Name: Angel Gaines        MRN: [id number]  Date of Service: 06/01/2019 DOB: [birth year]  CC:Sun, Gari Crown, MD  Stark Klein, MD     REFERRING PHYSICIAN: Stark Klein, MD   DIAGNOSIS: The encounter diagnosis was Malignant neoplasm of upper-outer quadrant of left breast in female, estrogen receptor positive ([place]).   HISTORY OF PRESENT ILLNESS: Angel Gaines is a 63 y.o. female with a newly diagnosed left sided DCIS. The patient went for screening mammogram that revealed a 5 mm group of calcifications in the upper outer quadrant of the left breast. She underwent diagnostic ultrasound of the axilla which was negative for disease. A stereotactic biopsy on 05/19/2019 revealing a high grade DCIS that was ER/PR positive. She is scheduled to meet with Dr. Jana Hakim next week, and is scheduled to undergo lumpectomy on 07/15/2019. She is seen today via webex telemedicine platform.    PREVIOUS RADIATION THERAPY: No   PAST MEDICAL HISTORY:  Past Medical History:  Diagnosis Date  . Pressure in head        PAST SURGICAL HISTORY:History reviewed. No pertinent surgical history.   FAMILY HISTORY:  Family History  Problem Relation Age of Onset  . Sarcoidosis Mother   . Lung cancer Father        Smoker     SOCIAL HISTORY:  reports that she has never smoked. She has never used smokeless tobacco. She reports current alcohol use. She reports that she does not use drugs.  The patient is single.  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  She enjoys traveling.   ALLERGIES: Patient has no known allergies.   MEDICATIONS:  Current Outpatient Medications  Medication Sig Dispense Refill  . aspirin EC 81 MG tablet Take 81 mg by mouth daily.    . Cholecalciferol (VITAMIN D) 2000 units CAPS Take 2,000 Units by mouth daily.    . Multiple Vitamin (MULTIVITAMIN) tablet Take 1 tablet by mouth daily.    . Omega-3 Fatty Acids  (FISH OIL PO) Take by mouth.     No current facility-administered medications for this encounter.      REVIEW OF SYSTEMS: On review of systems, the patient reports that she is doing well overall. She denies any chest pain, shortness of breath, cough, fevers, chills, night sweats, unintended weight changes. She denies any bowel or bladder disturbances, and denies abdominal pain, nausea or vomiting. She denies any new musculoskeletal or joint aches or pains. A complete review of systems is obtained and is otherwise negative.  She also mentions she is hoping to still be able to travel this fall as she and her college friends have plan to go on a bike trip through [place].  She knows that COVID could still impact the ability to do this but is curious how this impacts treatment related decision making.    PHYSICAL EXAM:  Wt Readings from Last 3 Encounters:  06/01/19 150 lb (68 kg)  05/07/17 171 lb (77.6 kg)  02/27/17 168 lb (76.2 kg)   Temp Readings from Last 3 Encounters:  No data found for Temp   BP Readings from Last 3 Encounters:  05/07/17 (!) 145/88  02/27/17 (!) 147/90  02/03/17 136/87   Pulse Readings from Last 3 Encounters:  05/07/17 74  02/27/17 77  02/03/17 72   In general this is a well appearing caucasian female in no acute distress. She's alert  and oriented x4 and appropriate throughout the examination. Cardiopulmonary assessment is negative for acute distress and she exhibits normal effort. Breast exam is deferred.   ECOG = 0  0 - Asymptomatic (Fully active, able to carry on all predisease activities without restriction)  1 - Symptomatic but completely ambulatory (Restricted in physically strenuous activity but ambulatory and able to carry out work of a light or sedentary nature. For example, light housework, office work)  2 - Symptomatic, <50% in bed during the day (Ambulatory and capable of all self care but unable to carry out any work activities. Up and about  more than 50% of waking hours)  3 - Symptomatic, >50% in bed, but not bedbound (Capable of only limited self-care, confined to bed or chair 50% or more of waking hours)  4 - Bedbound (Completely disabled. Cannot carry on any self-care. Totally confined to bed or chair)  5 - Death   Eustace Pen MM, Creech RH, Tormey DC, et al. [phone]). "Toxicity and response criteria of the [facility]". [facility] Oncol. 5 (6): 649-55    LABORATORY DATA:  No results found for: WBC, HGB, HCT, MCV, PLT No results found for: NA, K, CL, CO2 No results found for: ALT, AST, GGT, ALKPHOS, BILITOT    RADIOGRAPHY: No results found.     IMPRESSION/PLAN: 1. ER/PR positive DCIS of the left breast. Dr. Lisbeth Renshaw discusses the pathology findings and reviews the nature of noninvasive left breast disease.  She is planning to meet with Dr. Jana Hakim next week to discuss adjuvant options following surgery.  We discussed that based on her surgical approach, she would be benefited by external radiotherapy to the breast followed by antiestrogen therapy to reduce the risk of local recurrence. We discussed the risks, benefits, short, and long term effects of radiotherapy, and the patient is interested in proceeding. Dr. Lisbeth Renshaw discusses the delivery and logistics of radiotherapy and anticipates a course of 4 weeks of radiotherapy. We will see her back about 2 weeks after surgery to discuss the simulation process and anticipate we starting radiotherapy about 4-6 weeks after surgery.  Given her plans for her upcoming trip in October, we have gone ahead already and made plans for her follow-up new appointment based on her surgical dates, and for simulation.  She understands that this could change based on the final pathology from surgery.   This encounter was provided by telemedicine platform Mychart.  The patient has given verbal consent for this type of encounter and has been advised to only accept a meeting of this  type in a secure network environment. The time spent during this encounter was 60 minutes. The attendants for this meeting include Blenda Nicely, RN, Dr. Lisbeth Renshaw, Hayden Pedro  and Jeannene Patella.  During the encounter,  Blenda Nicely, RN, Dr. Lisbeth Renshaw, and Hayden Pedro were located at [facility] Radiation Oncology Department.  Angel Gaines was located at home.    The above documentation reflects my direct findings during this shared patient visit. Please see the separate note by Dr. Lisbeth Renshaw on this date for the remainder of the patient's plan of care.    Carola Rhine, PAC

## 2019-06-01 NOTE — Progress Notes (Signed)
Location of Breast Cancer: Malignant neoplasm of upper outer quadrant of left breast, +  Plan: ? Start antiestrogen prior to surgery given delayed surgery.  Did patient present with symptoms (if so, please note symptoms) or was this found on screening mammography?: Mammogram  Histology per Pathology Report: Left Breast 05/19/2019   Receptor Status: ER(100% +), PR (60% +), Her2-neu (), Ki-()   Past/Anticipated interventions by surgeon, if any: Dr. Barry Dienes Left breast lumpectomy with radioactive seed localization scheduled for 07/15/2019   Past/Anticipated interventions by medical oncology, if any: Chemotherapy  Dr. Jana Hakim 06/08/2019  Lymphedema issues, if any: No  Pain issues, if any:  Some tenderness at the biopsy site.  SAFETY ISSUES:  Prior radiation? No  Pacemaker/ICD? No  Possible current pregnancy? Postmenopausal  Is the patient on methotrexate? No  Current Complaints / other details:      Cori Razor, RN 06/01/2019,9:39 AM

## 2019-06-07 ENCOUNTER — Telehealth: Payer: Self-pay

## 2019-06-07 NOTE — Progress Notes (Signed)
[place]  Telephone:([phone]) [phone] Fax:(336) [phone]     ID: Angel Gaines DOB: 01/20/1956  MR#: [id number]  NIO#:[id number]  Patient Care Team: Donald Prose, MD as PCP - General (Family Medicine) Izora Gala, MD as Consulting Physician (Otolaryngology) Eladio Dentremont, Virgie Dad, MD as Consulting Physician (Oncology) Kyung Rudd, MD as Consulting Physician (Radiation Oncology) Stark Klein, MD as Consulting Physician (General Surgery) Chauncey Cruel, MD OTHER MD:  CHIEF COMPLAINT: Ductal carcinoma in situ, left, estrogen receptor positive  CURRENT TREATMENT: Awaiting definitive surgery   HISTORY OF CURRENT ILLNESS: "Angel Gaines" had routine screening mammography on 05/11/2018 showing a possible abnormality in the left breast. Diagnostic mammogram on 05/15/2018 showed calcifications in the left breast, and she was recommended to follow up in 6 months. These calcifications remained stable Deceber 2019 by report. She underwent bilateral diagnostic mammography with tomography and left breast ultrasonography at [facility] on 05/13/2019 showing: breast density category C; 4 mm area of grouped calcifications in the left breast upper outer aspect posterior depth. Left axilla ultrasound was performed on 05/19/2019 and showed no evidence of malignancy.  Accordingly on 05/19/2019 she proceeded to biopsy of the left breast area in question. The pathology from this procedure ([id number]) showed: ductal carcinoma in situ with calcifications, grade 3; fibrocystic changes with calcifications. Prognostic indicators significant for: estrogen receptor, 100% positive with strong staining intensity and progesterone receptor, 60% positive with moderate to weak staining intensity.   The patient's subsequent history is as detailed below.   INTERVAL HISTORY: Angel Gaines was evaluated in the breast cancer clinic on 06/08/2019. Her case was also presented at the multidisciplinary breast cancer conference on 05/26/2019.  At that time a preliminary plan was proposed: Breast conserving surgery, adjuvant radiation, antiestrogens  She met with Dr. Lisbeth Renshaw on 06/01/2019 and adjuvant radiation for 4 weeks is planned.  She is scheduled to undergo left breast lumpectomy on 07/15/2019 under Dr. Barry Dienes.   REVIEW OF SYSTEMS: The patient  reports she walks for exercise, usually a mile a day, and she previously went to the gym prior to the pandemic. She notes she used to walk her dog, but her "dog situation has changed." Her adopted daughter is home from [place] for the summer and working part time. The patient denies unusual headaches, visual changes, nausea, vomiting, stiff neck, dizziness, or gait imbalance. There has been no cough, phlegm production, or pleurisy, no chest pain or pressure, and no change in bowel or bladder habits. The patient denies fever, rash, bleeding, unexplained fatigue or unexplained weight loss. A detailed review of systems was otherwise entirely negative.   PAST MEDICAL HISTORY: Past Medical History:  Diagnosis Date  . Pressure in head     PAST SURGICAL HISTORY: No past surgical history on file.  FAMILY HISTORY: Family History  Problem Relation Age of Onset  . Sarcoidosis Mother   . Lung cancer Father        Smoker  . Breast cancer Maternal Grandfather    As of June 2020 patient's father is living at 49 years old. He was adopted, so she doesn't know anything about his family. He has a history of lung cancer and was a heavy smoker.  Patient's mother is also living at age 70.  The patient's maternal grandfather was diagnosed with breast cancer.  The patient has 3 siblings, 2 sisters and 1 brother. Unfortunately, her brother died in a car accident when he was 33.  GYNECOLOGIC HISTORY:  No LMP recorded. Patient is postmenopausal. Menarche: 63 years old  GX P 0 LMP around age 50 Contraceptive: taken for 2 years with no complications HRT: no  Hysterectomy? no BSO? no   SOCIAL  HISTORY: (updated 06/08/2019)  Angel Gaines is currently working in Engineer, technical sales for [employer]. She is single, never married. She lives at home with her adopted daughter, Angel Gaines. 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     ADVANCED DIRECTIVES: Her sister, Angel Gaines, is her HCPOA.  Angel Gaines can be reached at [phone]   HEALTH MAINTENANCE: Social History   Tobacco Use  . Smoking status: Never Smoker  . Smokeless tobacco: Never Used  Substance Use Topics  . Alcohol use: Yes    Alcohol/week: 2.0 standard drinks    Types: 2 Glasses of wine per week  . Drug use: No     Colonoscopy: due 2021  PAP: 04/30/2019, negative  Bone density: 2007, -1.1 (osteopenia)   No Known Allergies  Current Outpatient Medications  Medication Sig Dispense Refill  . Cholecalciferol (VITAMIN D) 2000 units CAPS Take 2,000 Units by mouth daily.    . Multiple Vitamin (MULTIVITAMIN) tablet Take 1 tablet by mouth daily.    . Omega-3 Fatty Acids (FISH OIL PO) Take by mouth.    Marland Kitchen aspirin EC 81 MG tablet Take 81 mg by mouth daily.    . tamoxifen (NOLVADEX) 20 MG tablet Take 1 tablet (20 mg total) by mouth daily. 90 tablet 4   No current facility-administered medications for this visit.     OBJECTIVE: Middle-aged white woman in no acute distress  Vitals:   06/08/19 1623  BP: 130/69  Pulse: 68  Resp: 18  Temp: 98.7 F (37.1 C)  SpO2: 100%     Body mass index is 27.75 kg/m.   Wt Readings from Last 3 Encounters:  06/08/19 151 lb 11.2 oz (68.8 kg)  06/01/19 150 lb (68 kg)  05/07/17 171 lb (77.6 kg)      ECOG FS:0 - Asymptomatic  Ocular: Sclerae unicteric, pupils round and equal Wearing a mask Lymphatic: No cervical or supraclavicular adenopathy Lungs no rales or rhonchi Heart regular rate and rhythm Abd soft, nontender, positive bowel sounds MSK no focal spinal tenderness, no joint edema Neuro: non-focal, well-oriented, appropriate affect Breasts: The right breast is  unremarkable.  The left breast is status post recent biopsy.  There is a small ecchymosis in the left lower outer quadrant.  There is no palpable mass.  There are no skin or nipple changes of concern.  Both axillae are benign.   LAB RESULTS:  CMP  No results found for: NA, K, CL, CO2, GLUCOSE, BUN, CREATININE, CALCIUM, PROT, ALBUMIN, AST, ALT, ALKPHOS, BILITOT, GFRNONAA, GFRAA  No results found for: TOTALPROTELP, ALBUMINELP, A1GS, A2GS, BETS, BETA2SER, GAMS, MSPIKE, SPEI  No results found for: KPAFRELGTCHN, LAMBDASER, KAPLAMBRATIO  No results found for: WBC, NEUTROABS, HGB, HCT, MCV, PLT  @LASTCHEMISTRY @  No results found for: LABCA2  No components found for: [id number]  No results for input(s): INR in the last 168 hours.  No results found for: LABCA2  No results found for: ZGY174  No results found for: BSW967  No results found for: RFF638  No results found for: CA2729  No components found for: HGQUANT  No results found for: CEA1 / No results found for: CEA1   No results found for: AFPTUMOR  No results found for: CHROMOGRNA  No results found for: PSA1  No visits with results within 3 Day(s) from this visit.  Latest known visit  with results is:  Orders Only on 04/30/2019  Component Date Value Ref Range Status  . Adequacy 04/30/2019 Satisfactory for evaluation  endocervical/transformation zone component PRESENT.   Final  . Diagnosis 04/30/2019 NEGATIVE FOR INTRAEPITHELIAL LESIONS OR MALIGNANCY. BENIGN REACTIVE/REPARATIVE CHANGES.   Final  . HPV 04/30/2019 NOT DETECTED   Final   Normal Reference Range - NOT Detected  . Material Submitted 04/30/2019 CervicoVaginal Pap [ThinPrep Imaged]   Final    (this displays the last labs from the last 3 days)  No results found for: TOTALPROTELP, ALBUMINELP, A1GS, A2GS, BETS, BETA2SER, GAMS, MSPIKE, SPEI (this displays SPEP labs)  No results found for: KPAFRELGTCHN, LAMBDASER, KAPLAMBRATIO (kappa/lambda light chains)  No  results found for: HGBA, HGBA2QUANT, HGBFQUANT, HGBSQUAN (Hemoglobinopathy evaluation)   No results found for: LDH  No results found for: IRON, TIBC, IRONPCTSAT (Iron and TIBC)  No results found for: FERRITIN  Urinalysis No results found for: COLORURINE, APPEARANCEUR, LABSPEC, PHURINE, GLUCOSEU, HGBUR, BILIRUBINUR, KETONESUR, PROTEINUR, UROBILINOGEN, NITRITE, LEUKOCYTESUR   STUDIES: No results found.  ELIGIBLE FOR AVAILABLE RESEARCH PROTOCOL: no  ASSESSMENT: 63 y.o. [place] woman status post left breast biopsy 05/19/2023 is 0.4 cm area of ductal carcinoma in situ, grade 3, estrogen and progesterone receptor positive  (1) definitive surgery pending  (2) adjuvant radiation to follow-up  (3) tamoxifen started neoadjuvantly given anticipated surgical delays  This is 4) genetics testing pending  PLAN: I spent approximately 60 minutes face to face with [facility] with more than 50% of that time spent in counseling and coordination of care. Specifically we reviewed the biology of the patient's diagnosis and the specifics of her situation.  Sheunderstands that in noninvasive ductal carcinoma, also called ductal carcinoma in situ ("DCIS") the breast cancer cells remain trapped in the ducts were they started. They cannot travel to a vital organ. For that reason these cancers in themselves are not life-threatening.  If the whole breast is removed then all the ducts are removed and since the cancer cells are trapped in the ducts, the cure rate with mastectomy for noninvasive breast cancer is approximately 99%. Nevertheless we recommend lumpectomy, because there is no survival advantage to mastectomy and because the cosmetic result is generally superior with breast conservation.  Since the patient is keeping her breasts, there will be some risk of recurrence. The recurrence can only be in the same breast since, again, the cells are trapped in the ducts. There is no connection from one breast to  the other. The risk of local recurrence is cut by more than half with radiation, which is standard in this situation.  In estrogen receptor positive cancers like Angel Gaines, anti-estrogens can also be considered. They will further reduce the risk of recurrence by one half. In addition anti-estrogens will lower the risk of a new breast cancer developing in either breast, also by one half.   Angel Gaines is interested in starting antiestrogens early since her surgery will be delayed until August.  She is particularly interested in tamoxifen and I have placed that prescription in for her.  In addition she qualifies for genetics testing. In patients who carry a deleterious mutation [for example in a  BRCA gene], the risk of a new breast cancer developing in the future may be sufficiently great that the patient may choose bilateral mastectomies. However if she wishes to keep her breasts in that situation it is safe to do so. That would require intensified screening, which generally means not only yearly mammography but a yearly breast MRI as  well. Of course, if there is a deleterious mutation bilateral oophorectomy would be necessary as there is no standard screening protocol for ovarian cancer.  Angel Gaines has a good understanding of the overall plan. She agrees with it. She knows the goal of treatment in her case is cure. She will call with any problems that may develop before her next visit here, which will be early October.   Chauncey Cruel, MD   06/08/2019 5:32 PM Medical Oncology and Hematology [facility] [address] [place], [place] [address] Tel. [phone]    Fax. [phone]   This document serves as a record of services personally performed by Lurline Del, MD. It was created on his behalf by Wilburn Mylar, a trained medical scribe. The creation of this record is based on the scribe's personal observations and the provider's statements to them.   I, Lurline Del MD,  have reviewed the above documentation for accuracy and completeness, and I agree with the above.

## 2019-06-07 NOTE — Telephone Encounter (Signed)
Called and left voicemail regarding pre-screening questions for appt on 6/30 

## 2019-06-08 ENCOUNTER — Inpatient Hospital Stay: Payer: 59 | Attending: Oncology | Admitting: Oncology

## 2019-06-08 ENCOUNTER — Encounter: Payer: Self-pay | Admitting: Oncology

## 2019-06-08 ENCOUNTER — Other Ambulatory Visit: Payer: Self-pay

## 2019-06-08 VITALS — BP 130/69 | HR 68 | Temp 98.7°F | Resp 18 | Ht 62.0 in | Wt 151.7 lb

## 2019-06-08 DIAGNOSIS — Z79899 Other long term (current) drug therapy: Secondary | ICD-10-CM | POA: Diagnosis not present

## 2019-06-08 DIAGNOSIS — Z170 Estrogen receptor positive status [ER+]: Secondary | ICD-10-CM | POA: Diagnosis not present

## 2019-06-08 DIAGNOSIS — M8580 Other specified disorders of bone density and structure, unspecified site: Secondary | ICD-10-CM | POA: Diagnosis not present

## 2019-06-08 DIAGNOSIS — Z7982 Long term (current) use of aspirin: Secondary | ICD-10-CM

## 2019-06-08 DIAGNOSIS — Z803 Family history of malignant neoplasm of breast: Secondary | ICD-10-CM

## 2019-06-08 DIAGNOSIS — D0512 Intraductal carcinoma in situ of left breast: Secondary | ICD-10-CM | POA: Diagnosis present

## 2019-06-08 DIAGNOSIS — C50412 Malignant neoplasm of upper-outer quadrant of left female breast: Secondary | ICD-10-CM

## 2019-06-08 MED ORDER — TAMOXIFEN CITRATE 20 MG PO TABS: 20.0000 mg | ORAL_TABLET | Freq: Every day | ORAL | 4 refills | Status: AC

## 2019-06-09 ENCOUNTER — Telehealth: Payer: Self-pay | Admitting: Oncology

## 2019-06-09 ENCOUNTER — Encounter: Payer: Self-pay | Admitting: *Deleted

## 2019-06-09 NOTE — Telephone Encounter (Signed)
I could not reach patient °

## 2019-06-22 ENCOUNTER — Inpatient Hospital Stay: Payer: 59 | Attending: Oncology | Admitting: Genetic Counselor

## 2019-06-22 ENCOUNTER — Encounter: Payer: Self-pay | Admitting: Genetic Counselor

## 2019-06-22 ENCOUNTER — Inpatient Hospital Stay: Payer: 59

## 2019-06-22 ENCOUNTER — Other Ambulatory Visit: Payer: Self-pay

## 2019-06-22 DIAGNOSIS — Z801 Family history of malignant neoplasm of trachea, bronchus and lung: Secondary | ICD-10-CM | POA: Diagnosis not present

## 2019-06-22 DIAGNOSIS — Z8049 Family history of malignant neoplasm of other genital organs: Secondary | ICD-10-CM | POA: Diagnosis not present

## 2019-06-22 DIAGNOSIS — C50412 Malignant neoplasm of upper-outer quadrant of left female breast: Secondary | ICD-10-CM | POA: Diagnosis not present

## 2019-06-22 DIAGNOSIS — Z170 Estrogen receptor positive status [ER+]: Secondary | ICD-10-CM

## 2019-06-22 DIAGNOSIS — Z803 Family history of malignant neoplasm of breast: Secondary | ICD-10-CM | POA: Diagnosis not present

## 2019-06-22 NOTE — Progress Notes (Signed)
REFERRING PROVIDER: Chauncey Cruel, MD [address] [place],   [address]  PRIMARY PROVIDER:  Donald Prose, MD  PRIMARY REASON FOR VISIT:  1. Malignant neoplasm of upper-outer quadrant of left breast in female, estrogen receptor positive ([place])   2. Family history of breast cancer in female   3. Family history of cervical cancer   4. Family history of lung cancer      HISTORY OF PRESENT ILLNESS:   Angel Gaines, a 63 y.o. female, was seen for a [facility] cancer genetics consultation at the request of Dr. Jana Hakim due to a personal and family history of breast cancer.  Angel Gaines presents to clinic today to discuss the possibility of a hereditary predisposition to cancer, genetic testing, and to further clarify her future cancer risks, as well as potential cancer risks for family members.   In 2020, at the age of 22, Angel Gaines was diagnosed with DCIS of the left breast. The treatment plan includes radiation, surgery, and antiestrogen therapy.   CANCER HISTORY:  Oncology History  Malignant neoplasm of upper-outer quadrant of left breast in female, estrogen receptor positive ([place])  05/26/2019 Initial Diagnosis   Malignant neoplasm of upper-outer quadrant of left breast in female, estrogen receptor positive ([place])   05/26/2019 Cancer Staging   Staging form: Breast, AJCC 8th Edition - Clinical: Stage 0 (cTis (DCIS), cN0, cM0, ER+, PR+) - Signed by Gardenia Phlegm, NP on 05/26/2019     RISK FACTORS:  Menarche was at age 68.  No live births.  OCP use for approximately 2 years.  Ovaries intact: yes.  Hysterectomy: no.  Menopausal status: postmenopausal.  HRT use: 0 years. Mammogram within the last year: yes. Number of breast biopsies: 1.   Past Medical History:  Diagnosis Date  . Family history of breast cancer in female   . Family history of cervical cancer   . Family history of lung cancer   . Pressure in head     No past surgical history on file.   Social History   Socioeconomic History  . Marital status: Single    Spouse name: Not on file  . Number of children: 1  . Years of education: Masters  . Highest education level: Not on file  Occupational History  . Occupation: Biomedical engineer  Social Needs  . Financial resource strain: Not on file  . Food insecurity    Worry: Not on file    Inability: Not on file  . Transportation needs    Medical: No    Non-medical: No  Tobacco Use  . Smoking status: Never Smoker  . Smokeless tobacco: Never Used  Substance and Sexual Activity  . Alcohol use: Yes    Alcohol/week: 2.0 standard drinks    Types: 2 Glasses of wine per week  . Drug use: No  . Sexual activity: Never  Lifestyle  . Physical activity    Days per week: Not on file    Minutes per session: Not on file  . Stress: Not on file  Relationships  . Social Herbalist on phone: Not on file    Gets together: Not on file    Attends religious service: Not on file    Active member of club or organization: Not on file    Attends meetings of clubs or organizations: Not on file    Relationship status: Not on file  Other Topics Concern  . Not on file  Social History Narrative  Lives at home with one adopted daughter.   1-2 cups caffeine daily.   Right-handed.     FAMILY HISTORY:  We obtained a detailed, 4-generation family history.  Significant diagnoses are listed below: Family History  Problem Relation Age of Onset  . Sarcoidosis Mother   . Lung cancer Father 14       Smoker  . Breast cancer Maternal Grandfather 72  . Cervical cancer Maternal Aunt 68     Angel Gaines has one adopted daughter and no biological children. She has a brother, Sherren Mocha, who died in a car accident when he was 7. She also has two sisters, Malachy Mood and Research scientist (physical sciences), who each have three sons. None of these individual have had a diagnosis of cancer.  Ms. Voigt mother is 49 with no history of cancer. Ms. [facility] aunt, Constance Holster, had cervical  cancer when she was 57. Ms. Gamm has another maternal aunt, Carlyon Shadow, and maternal uncle, Marland Kitchen but has limited information regarding their health and any potential cancer diagnoses. Her maternal grandfather had a diagnosis of breast cancer when he was in his late 84s.  Ms. Rittenhouse father had lung cancer diagnosed in his early 19s and was a smoker throughout most of his life. Her father was adopted and, as a result, Ms. Meter has no information about his side of the family.  Ms. Claflin is unaware of previous family history of genetic testing for hereditary cancer risks. Her maternal ancestors are of [place] descent, and paternal ancestors are of unknown, but likely [place] descent. There is no reported Ashkenazi Jewish ancestry. There is no known consanguinity.  GENETIC COUNSELING ASSESSMENT: Angel Gaines is a 63 y.o. female with a personal and family history of breast cancer which is somewhat suggestive of a hereditary cancer syndrome and predisposition to cancer. We, therefore, discussed and recommended the following at today's visit.   DISCUSSION: We discussed that 5 - 10% of breast cancer is hereditary, with most cases associated with BRCA1/2.  There are other genes that can be associated with hereditary breast cancer syndromes.  These include ATM, PALB2, CHEK2, etc.  We discussed that testing is beneficial for several reasons including surgical decision-making for breast cancer, knowing how to follow individuals after completing their treatment, and to understand if other family members could be at risk for cancer and allow them to undergo genetic testing.   We reviewed the characteristics, features and inheritance patterns of hereditary cancer syndromes. We also discussed genetic testing, including the appropriate family members to test, the process of testing, insurance coverage and turn-around-time for results. We discussed the implications of a negative, positive and/or variant of  uncertain significant result. In order to get genetic test results in a timely manner so that Angel Gaines can use these genetic test results for surgical decisions, we recommended Ms. Ra pursue genetic testing for the [facility] Breast Cancer STAT gene panel. Once complete, we recommend Angel Gaines pursue reflex genetic testing to the [employer] gene panel.   The STAT Breast cancer panel offered by [facility] includes sequencing and rearrangement analysis for the following 9 genes:  ATM, BRCA1, BRCA2, CDH1, CHEK2, PALB2, PTEN, STK11 and TP53.     The Multi-Cancer Gene Panel offered by [facility] includes sequencing and/or deletion duplication testing of the following 85 genes: AIP, ALK, APC, ATM, AXIN2,BAP1,  BARD1, BLM, BMPR1A, BRCA1, BRCA2, BRIP1, CASR, CDC73, CDH1, CDK4, CDKN1B, CDKN1C, CDKN2A (p14ARF), CDKN2A (p16INK4a), CEBPA, CHEK2, CTNNA1, DICER1, DIS3L2, EGFR (c.2369C>T, p.Thr790Met variant only), EPCAM (Deletion/duplication testing only), FH,  FLCN, GATA2, GPC3, GREM1 (Promoter region deletion/duplication testing only), HOXB13 (c.251G>A, p.Gly84Glu), HRAS, KIT, MAX, MEN1, MET, MITF (c.952G>A, p.Glu318Lys variant only), MLH1, MSH2, MSH3, MSH6, MUTYH, NBN, NF1, NF2, NTHL1, PALB2, PDGFRA, PHOX2B, PMS2, POLD1, POLE, POT1, PRKAR1A, PTCH1, PTEN, RAD50, RAD51C, RAD51D, RB1, RECQL4, RET, RNF43, RUNX1, SDHAF2, SDHA (sequence changes only), SDHB, SDHC, SDHD, SMAD4, SMARCA4, SMARCB1, SMARCE1, STK11, SUFU, TERC, TERT, TMEM127, TP53, TSC1, TSC2, VHL, WRN and WT1.    Based on Angel Gaines's personal and family history of cancer, she meets medical criteria for genetic testing. Despite that she meets criteria, she may still have an out of pocket cost.   PLAN: After considering the risks, benefits, and limitations, Angel Gaines provided informed consent to pursue genetic testing and the blood sample was sent to [facility] for analysis of the Breast Cancer STAT panel + Multi-Cancer panel. Initial results  should be available within approximately one weeks' time, at which point they will be disclosed by telephone to Angel Gaines, as will any additional recommendations warranted by these results. Angel Gaines will receive a summary of her genetic counseling visit and a copy of her results once available. This information will also be available in Epic.   Angel Gaines questions were answered to her satisfaction today. Our contact information was provided should additional questions or concerns arise. Thank you for the referral and allowing [place] to share in the care of your patient.   Clint Guy, MS Genetic Counselor [email or web address] Phone: [phone]   The patient was seen for a total of 30 minutes in face-to-face genetic counseling.  This patient was discussed with Drs. Magrinat, Lindi Adie and/or Burr Medico who agrees with the above.    _______________________________________________________________________ For Office Staff:  Number of people involved in session: 1 Was an Intern/ student involved with case: no

## 2019-06-28 ENCOUNTER — Ambulatory Visit: Payer: Self-pay | Admitting: Genetic Counselor

## 2019-06-28 ENCOUNTER — Telehealth: Payer: Self-pay | Admitting: Genetic Counselor

## 2019-06-28 ENCOUNTER — Encounter: Payer: Self-pay | Admitting: Genetic Counselor

## 2019-06-28 DIAGNOSIS — Z1379 Encounter for other screening for genetic and chromosomal anomalies: Secondary | ICD-10-CM | POA: Insufficient documentation

## 2019-06-28 NOTE — Progress Notes (Signed)
HPI:  Ms. Worthey was previously seen in the [facility] due to a personal and family history of breast cancer and concerns regarding a hereditary predisposition to cancer. Please refer to our prior cancer genetics clinic note for more information regarding our discussion, assessment and recommendations, at the time. Ms. Wesenberg recent genetic test results were disclosed to her, as were recommendations warranted by these results. These results and recommendations are discussed in more detail below.  CANCER HISTORY:  Oncology History  Malignant neoplasm of upper-outer quadrant of left breast in female, estrogen receptor positive ([place])  05/26/2019 Initial Diagnosis   Malignant neoplasm of upper-outer quadrant of left breast in female, estrogen receptor positive ([place])   05/26/2019 Cancer Staging   Staging form: Breast, AJCC 8th Edition - Clinical: Stage 0 (cTis (DCIS), cN0, cM0, ER+, PR+) - Signed by Gardenia Phlegm, NP on 05/26/2019   06/28/2019 Genetic Testing   Negative testing on Multi-Cancer gene panel through [facility].   The Multi-Cancer Panel offered by [facility] includes sequencing and/or deletion duplication testing of the following 85 genes: AIP, ALK, APC, ATM, AXIN2,BAP1,  BARD1, BLM, BMPR1A, BRCA1, BRCA2, BRIP1, CASR, CDC73, CDH1, CDK4, CDKN1B, CDKN1C, CDKN2A (p14ARF), CDKN2A (p16INK4a), CEBPA, CHEK2, CTNNA1, DICER1, DIS3L2, EGFR (c.2369C>T, p.Thr790Met variant only), EPCAM (Deletion/duplication testing only), FH, FLCN, GATA2, GPC3, GREM1 (Promoter region deletion/duplication testing only), HOXB13 (c.251G>A, p.Gly84Glu), HRAS, KIT, MAX, MEN1, MET, MITF (c.952G>A, p.Glu318Lys variant only), MLH1, MSH2, MSH3, MSH6, MUTYH, NBN, NF1, NF2, NTHL1, PALB2, PDGFRA, PHOX2B, PMS2, POLD1, POLE, POT1, PRKAR1A, PTCH1, PTEN, RAD50, RAD51C, RAD51D, RB1, RECQL4, RET, RNF43, RUNX1, SDHAF2, SDHA (sequence changes only), SDHB, SDHC, SDHD, SMAD4, SMARCA4, SMARCB1, SMARCE1, STK11, SUFU,  TERC, TERT, TMEM127, TP53, TSC1, TSC2, VHL, WRN and WT1.  The report date is 06/28/2019.     FAMILY HISTORY:  We obtained a detailed, 4-generation family history.  Significant diagnoses are listed below: Family History  Problem Relation Age of Onset   Sarcoidosis Mother    Lung cancer Father 54       Smoker   Breast cancer Maternal Grandfather 77   Cervical cancer Maternal Aunt 68      Ms. Spirito has one adopted daughter and no biological children. She has a brother, Sherren Mocha, who died in a car accident when he was 42. She also has two sisters, Malachy Mood and Research scientist (physical sciences), who each have three sons. None of these individual have had a diagnosis of cancer.  Ms. Linville mother is 50 with no history of cancer. Ms. [facility] aunt, Constance Holster, had cervical cancer when she was 54. Ms. Pixley has another maternal aunt, Carlyon Shadow, and a maternal uncle, Marland Kitchen, but has limited information regarding their health and any potential cancer diagnoses. Her maternal grandfather had a diagnosis of breast cancer when he was in his late 54s.  Ms. Marcucci father had lung cancer diagnosed in his early 23s and was a smoker throughout most of his life. Her father was adopted and, as a result, Ms. Tancredi has no information about his side of the family.  Ms. Hanko is unaware of previous family history of genetic testing for hereditary cancer risks. Her maternal ancestors are of [place] descent, and paternal ancestors are of unknown, but likely [place] descent. There is no reported Ashkenazi Jewish ancestry. There is no known consanguinity.  GENETIC TEST RESULTS: Genetic testing reported out on 06/28/2019 through the [facility] Multi-Cancer cancer panel found no pathogenic mutations. The Multi-Gene Panel offered by [facility] includes sequencing and/or deletion duplication testing of the following 85  genes: AIP, ALK, APC, ATM, AXIN2,BAP1,  BARD1, BLM, BMPR1A, BRCA1, BRCA2, BRIP1, CASR, CDC73, CDH1, CDK4, CDKN1B, CDKN1C, CDKN2A  (p14ARF), CDKN2A (p16INK4a), CEBPA, CHEK2, CTNNA1, DICER1, DIS3L2, EGFR (c.2369C>T, p.Thr790Met variant only), EPCAM (Deletion/duplication testing only), FH, FLCN, GATA2, GPC3, GREM1 (Promoter region deletion/duplication testing only), HOXB13 (c.251G>A, p.Gly84Glu), HRAS, KIT, MAX, MEN1, MET, MITF (c.952G>A, p.Glu318Lys variant only), MLH1, MSH2, MSH3, MSH6, MUTYH, NBN, NF1, NF2, NTHL1, PALB2, PDGFRA, PHOX2B, PMS2, POLD1, POLE, POT1, PRKAR1A, PTCH1, PTEN, RAD50, RAD51C, RAD51D, RB1, RECQL4, RET, RNF43, RUNX1, SDHAF2, SDHA (sequence changes only), SDHB, SDHC, SDHD, SMAD4, SMARCA4, SMARCB1, SMARCE1, STK11, SUFU, TERC, TERT, TMEM127, TP53, TSC1, TSC2, VHL, WRN and WT1. The test report has been scanned into EPIC and is located under the Molecular Pathology section of the Results Review tab.  A portion of the result report is included below for reference.     We discussed with Ms. Vankuren that because current genetic testing is not perfect, it is possible there may be a gene mutation in one of these genes that current testing cannot detect, but that chance is small.  We also discussed, that there could be another gene that has not yet been discovered, or that we have not yet tested, that is responsible for the cancer diagnoses in the family. It is also possible there is a hereditary cause for the cancer in the family that Ms. Herzig did not inherit and therefore was not identified in her testing.  Therefore, it is important to remain in touch with cancer genetics in the future so that we can continue to offer Ms. Idleman the most up to date genetic testing.   ADDITIONAL GENETIC TESTING: We discussed with Ms. Labine that her genetic testing was fairly extensive.  If there are genes identified to increase cancer risk that can be analyzed in the future, we would be happy to discuss and coordinate this testing at that time.    CANCER SCREENING RECOMMENDATIONS: Ms. Gonser test result is considered negative  (normal).  This means that we have not identified a hereditary cause for her personal and family history of cancer at this time. Most cancers happen by chance and this negative test suggests that her cancer may fall into this category.    While reassuring, this does not definitively rule out a hereditary predisposition to cancer. It is still possible that there could be genetic mutations that are undetectable by current technology. There could be genetic mutations in genes that have not been tested or identified to increase cancer risk.  Therefore, it is recommended she continue to follow the cancer management and screening guidelines provided by her oncology and primary healthcare provider.   An individual's cancer risk and medical management are not determined by genetic test results alone. Overall cancer risk assessment incorporates additional factors, including personal medical history, family history, and any available genetic information that may result in a personalized plan for cancer prevention and surveillance  RECOMMENDATIONS FOR FAMILY MEMBERS:  Individuals in this family might be at some increased risk of developing cancer, over the general population risk, simply due to the family history of cancer.  We recommended women in this family have a yearly mammogram beginning at age 36, or 35 years younger than the earliest onset of cancer, an annual clinical breast exam, and perform monthly breast self-exams. Women in this family should also have a gynecological exam as recommended by their primary provider. All family members should have a colonoscopy by age 65.  It is also possible there  is a hereditary cause for the cancer in Ms. Runkle's family that she did not inherit and therefore was not identified in her.  Based on Ms. Glassburn's family history, we recommended her mother have genetic counseling and testing.  Her mother's results would provide further insight on whether Ms. Gulbranson's sisters  might also benefit from genetic testing.  Ms. Cirrincione will let us know if we can be of any assistance in coordinating genetic counseling and/or testing for this family member.   FOLLOW-UP: Lastly, we discussed with Ms. Londo that cancer genetics is a rapidly advancing field and it is possible that new genetic tests will be appropriate for her and/or her family members in the future. We encouraged her to remain in contact with cancer genetics on an annual basis so we can update her personal and family histories and let her know of advances in cancer genetics that may benefit this family.   Our contact number was provided. Ms. Apachito questions were answered to her satisfaction, and she knows she is welcome to call us at anytime with additional questions or concerns.   Clint Guy, MS Genetic Counselor [email or web address] Phone: [phone]

## 2019-06-28 NOTE — Telephone Encounter (Signed)
Revealed negative genetic testing.  Discussed that we do not know why she has breast cancer or why there is cancer in the family. It could be due to a different gene that we are not testing, or maybe our current technology may not be able to pick something up.  It will be important for her to keep in contact with genetics to keep up with whether additional testing may be needed. 

## 2019-06-29 ENCOUNTER — Encounter: Payer: Self-pay | Admitting: Oncology

## 2019-07-08 ENCOUNTER — Other Ambulatory Visit: Payer: Self-pay

## 2019-07-08 ENCOUNTER — Encounter (HOSPITAL_BASED_OUTPATIENT_CLINIC_OR_DEPARTMENT_OTHER): Payer: Self-pay | Admitting: *Deleted

## 2019-07-12 ENCOUNTER — Other Ambulatory Visit (HOSPITAL_COMMUNITY)
Admission: RE | Admit: 2019-07-12 | Discharge: 2019-07-12 | Disposition: A | Discharge status: Home / Self Care | Payer: 59 | Source: Ambulatory Visit | Attending: General Surgery | Admitting: General Surgery

## 2019-07-12 ENCOUNTER — Encounter: Payer: 59 | Admitting: Genetic Counselor

## 2019-07-12 DIAGNOSIS — Z20828 Contact with and (suspected) exposure to other viral communicable diseases: Secondary | ICD-10-CM | POA: Diagnosis not present

## 2019-07-12 DIAGNOSIS — C50912 Malignant neoplasm of unspecified site of left female breast: Secondary | ICD-10-CM | POA: Diagnosis not present

## 2019-07-12 DIAGNOSIS — Z01812 Encounter for preprocedural laboratory examination: Secondary | ICD-10-CM | POA: Insufficient documentation

## 2019-07-12 LAB — SARS CORONAVIRUS 2 (TAT 6-24 HRS): SARS Coronavirus 2: NEGATIVE

## 2019-07-13 NOTE — Progress Notes (Addendum)

## 2019-07-14 ENCOUNTER — Encounter: Payer: 59 | Admitting: Genetic Counselor

## 2019-07-15 ENCOUNTER — Encounter (HOSPITAL_BASED_OUTPATIENT_CLINIC_OR_DEPARTMENT_OTHER): Payer: Self-pay | Admitting: *Deleted

## 2019-07-15 ENCOUNTER — Ambulatory Visit (HOSPITAL_BASED_OUTPATIENT_CLINIC_OR_DEPARTMENT_OTHER): Payer: 59 | Admitting: Certified Registered"

## 2019-07-15 ENCOUNTER — Other Ambulatory Visit: Payer: Self-pay

## 2019-07-15 ENCOUNTER — Encounter (HOSPITAL_BASED_OUTPATIENT_CLINIC_OR_DEPARTMENT_OTHER)
Admission: RE | Disposition: A | Discharge status: Home / Self Care | Payer: Self-pay | Source: Home / Self Care | Attending: General Surgery

## 2019-07-15 ENCOUNTER — Ambulatory Visit (HOSPITAL_BASED_OUTPATIENT_CLINIC_OR_DEPARTMENT_OTHER)
Admission: RE | Admit: 2019-07-15 | Discharge: 2019-07-15 | Disposition: A | Discharge status: Home / Self Care | Payer: 59 | Attending: General Surgery | Admitting: General Surgery

## 2019-07-15 DIAGNOSIS — C50412 Malignant neoplasm of upper-outer quadrant of left female breast: Secondary | ICD-10-CM | POA: Diagnosis present

## 2019-07-15 DIAGNOSIS — Z7982 Long term (current) use of aspirin: Secondary | ICD-10-CM | POA: Diagnosis not present

## 2019-07-15 DIAGNOSIS — Z170 Estrogen receptor positive status [ER+]: Secondary | ICD-10-CM | POA: Diagnosis not present

## 2019-07-15 HISTORY — PX: BREAST LUMPECTOMY WITH RADIOACTIVE SEED LOCALIZATION: SHX6424

## 2019-07-15 SURGERY — BREAST LUMPECTOMY WITH RADIOACTIVE SEED LOCALIZATION
Anesthesia: General | Site: Breast | Laterality: Left

## 2019-07-15 MED ORDER — CEFAZOLIN SODIUM-DEXTROSE 2-4 GM/100ML-% IV SOLN
2.0000 g | INTRAVENOUS | Status: AC
  Administered 2019-07-15: 2 g via INTRAVENOUS

## 2019-07-15 MED ORDER — CEFAZOLIN SODIUM-DEXTROSE 2-4 GM/100ML-% IV SOLN
INTRAVENOUS | Status: AC
  Filled 2019-07-15: qty 100

## 2019-07-15 MED ORDER — BUPIVACAINE-EPINEPHRINE (PF) 0.25% -1:200000 IJ SOLN
INTRAMUSCULAR | Status: AC
  Filled 2019-07-15: qty 60

## 2019-07-15 MED ORDER — MIDAZOLAM HCL 2 MG/2ML IJ SOLN
INTRAMUSCULAR | Status: AC
  Filled 2019-07-15: qty 2

## 2019-07-15 MED ORDER — FENTANYL CITRATE (PF) 100 MCG/2ML IJ SOLN
INTRAMUSCULAR | Status: AC
  Filled 2019-07-15: qty 2

## 2019-07-15 MED ORDER — LACTATED RINGERS IV SOLN
INTRAVENOUS | Status: DC | PRN
  Administered 2019-07-15: 07:00:00 via INTRAVENOUS

## 2019-07-15 MED ORDER — LIDOCAINE 2% (20 MG/ML) 5 ML SYRINGE
INTRAMUSCULAR | Status: AC
  Filled 2019-07-15: qty 5

## 2019-07-15 MED ORDER — OXYCODONE HCL 5 MG PO TABS: 2.5000 mg | ORAL_TABLET | ORAL | 0 refills | Status: DC | PRN

## 2019-07-15 MED ORDER — DEXAMETHASONE SODIUM PHOSPHATE 10 MG/ML IJ SOLN
INTRAMUSCULAR | Status: DC | PRN
  Administered 2019-07-15: 10 mg via INTRAVENOUS

## 2019-07-15 MED ORDER — FENTANYL CITRATE (PF) 100 MCG/2ML IJ SOLN
INTRAMUSCULAR | Status: DC | PRN
  Administered 2019-07-15: 50 ug via INTRAVENOUS

## 2019-07-15 MED ORDER — LIDOCAINE-EPINEPHRINE (PF) 1 %-1:200000 IJ SOLN
INTRAMUSCULAR | Status: AC
  Filled 2019-07-15: qty 60

## 2019-07-15 MED ORDER — FENTANYL CITRATE (PF) 100 MCG/2ML IJ SOLN: 100.0000 ug | Freq: Once | INTRAMUSCULAR | Status: DC

## 2019-07-15 MED ORDER — EPHEDRINE SULFATE-NACL 50-0.9 MG/10ML-% IV SOSY
PREFILLED_SYRINGE | INTRAVENOUS | Status: DC | PRN
  Administered 2019-07-15: 5 mg via INTRAVENOUS

## 2019-07-15 MED ORDER — CHLORHEXIDINE GLUCONATE CLOTH 2 % EX PADS: 6.0000 | MEDICATED_PAD | Freq: Once | CUTANEOUS | Status: DC

## 2019-07-15 MED ORDER — GABAPENTIN 300 MG PO CAPS
ORAL_CAPSULE | ORAL | Status: AC
  Filled 2019-07-15: qty 1

## 2019-07-15 MED ORDER — GABAPENTIN 300 MG PO CAPS
300.0000 mg | ORAL_CAPSULE | ORAL | Status: AC
  Administered 2019-07-15: 300 mg via ORAL

## 2019-07-15 MED ORDER — MIDAZOLAM HCL 5 MG/5ML IJ SOLN
INTRAMUSCULAR | Status: DC | PRN
  Administered 2019-07-15: 2 mg via INTRAVENOUS

## 2019-07-15 MED ORDER — PROPOFOL 10 MG/ML IV BOLUS
INTRAVENOUS | Status: DC | PRN
  Administered 2019-07-15: 150 mg via INTRAVENOUS

## 2019-07-15 MED ORDER — LIDOCAINE-EPINEPHRINE (PF) 1 %-1:200000 IJ SOLN
INTRAMUSCULAR | Status: DC | PRN
  Administered 2019-07-15: 40 mL via INTRAMUSCULAR

## 2019-07-15 MED ORDER — BUPIVACAINE-EPINEPHRINE (PF) 0.5% -1:200000 IJ SOLN
INTRAMUSCULAR | Status: AC
  Filled 2019-07-15: qty 30

## 2019-07-15 MED ORDER — ACETAMINOPHEN 500 MG PO TABS
1000.0000 mg | ORAL_TABLET | ORAL | Status: AC
  Administered 2019-07-15: 1000 mg via ORAL

## 2019-07-15 MED ORDER — ACETAMINOPHEN 500 MG PO TABS
ORAL_TABLET | ORAL | Status: AC
  Filled 2019-07-15: qty 2

## 2019-07-15 MED ORDER — LACTATED RINGERS IV SOLN: INTRAVENOUS | Status: DC

## 2019-07-15 MED ORDER — MIDAZOLAM HCL 2 MG/2ML IJ SOLN: 1.0000 mg | INTRAMUSCULAR | Status: DC | PRN

## 2019-07-15 MED ORDER — LIDOCAINE 2% (20 MG/ML) 5 ML SYRINGE
INTRAMUSCULAR | Status: DC | PRN
  Administered 2019-07-15: 60 mg via INTRAVENOUS

## 2019-07-15 MED ORDER — KETOROLAC TROMETHAMINE 30 MG/ML IJ SOLN
INTRAMUSCULAR | Status: DC | PRN
  Administered 2019-07-15: 30 mg via INTRAVENOUS

## 2019-07-15 MED ORDER — ONDANSETRON HCL 4 MG/2ML IJ SOLN
INTRAMUSCULAR | Status: DC | PRN
  Administered 2019-07-15: 4 mg via INTRAVENOUS

## 2019-07-15 SURGICAL SUPPLY — 54 items
BINDER BREAST LRG (GAUZE/BANDAGES/DRESSINGS) ×4 IMPLANT
BINDER BREAST MEDIUM (GAUZE/BANDAGES/DRESSINGS) IMPLANT
BINDER BREAST XLRG (GAUZE/BANDAGES/DRESSINGS) IMPLANT
BINDER BREAST XXLRG (GAUZE/BANDAGES/DRESSINGS) IMPLANT
BLADE SURG 10 STRL SS (BLADE) ×2 IMPLANT
BLADE SURG 15 STRL LF DISP TIS (BLADE) IMPLANT
BLADE SURG 15 STRL SS (BLADE)
CANISTER SUC SOCK COL 7IN (MISCELLANEOUS) IMPLANT
CANISTER SUCT 1200ML W/VALVE (MISCELLANEOUS) ×2 IMPLANT
CHLORAPREP W/TINT 26 (MISCELLANEOUS) ×2 IMPLANT
CLIP VESOCCLUDE LG 6/CT (CLIP) ×2 IMPLANT
CLIP VESOCCLUDE MED 6/CT (CLIP) IMPLANT
COVER BACK TABLE REUSABLE LG (DRAPES) ×2 IMPLANT
COVER MAYO STAND REUSABLE (DRAPES) ×2 IMPLANT
COVER PROBE W GEL 5X96 (DRAPES) ×2 IMPLANT
COVER WAND RF STERILE (DRAPES) IMPLANT
DECANTER SPIKE VIAL GLASS SM (MISCELLANEOUS) IMPLANT
DERMABOND ADVANCED (GAUZE/BANDAGES/DRESSINGS) ×1
DERMABOND ADVANCED .7 DNX12 (GAUZE/BANDAGES/DRESSINGS) ×1 IMPLANT
DRAPE LAPAROSCOPIC ABDOMINAL (DRAPES) ×2 IMPLANT
DRAPE UTILITY XL STRL (DRAPES) ×2 IMPLANT
DRSG PAD ABDOMINAL 8X10 ST (GAUZE/BANDAGES/DRESSINGS) ×2 IMPLANT
ELECT COATED BLADE 2.86 ST (ELECTRODE) ×2 IMPLANT
ELECT REM PT RETURN 9FT ADLT (ELECTROSURGICAL) ×2
ELECTRODE REM PT RTRN 9FT ADLT (ELECTROSURGICAL) ×1 IMPLANT
GAUZE SPONGE 4X4 12PLY STRL LF (GAUZE/BANDAGES/DRESSINGS) ×2 IMPLANT
GLOVE BIO SURGEON STRL SZ 6 (GLOVE) ×2 IMPLANT
GLOVE BIOGEL PI IND STRL 6.5 (GLOVE) ×2 IMPLANT
GLOVE BIOGEL PI INDICATOR 6.5 (GLOVE) ×2
GLOVE ECLIPSE 6.5 STRL STRAW (GLOVE) ×2 IMPLANT
GLOVE EXAM NITRILE MD LF STRL (GLOVE) ×2 IMPLANT
GOWN STRL REUS W/ TWL LRG LVL3 (GOWN DISPOSABLE) ×1 IMPLANT
GOWN STRL REUS W/TWL 2XL LVL3 (GOWN DISPOSABLE) ×2 IMPLANT
GOWN STRL REUS W/TWL LRG LVL3 (GOWN DISPOSABLE) ×1
KIT MARKER MARGIN INK (KITS) ×2 IMPLANT
LIGHT WAVEGUIDE WIDE FLAT (MISCELLANEOUS) IMPLANT
NEEDLE HYPO 25X1 1.5 SAFETY (NEEDLE) ×2 IMPLANT
NS IRRIG 1000ML POUR BTL (IV SOLUTION) ×2 IMPLANT
PACK BASIN DAY SURGERY FS (CUSTOM PROCEDURE TRAY) ×2 IMPLANT
PENCIL BUTTON HOLSTER BLD 10FT (ELECTRODE) ×2 IMPLANT
SLEEVE SCD COMPRESS KNEE MED (MISCELLANEOUS) ×2 IMPLANT
SPONGE LAP 18X18 RF (DISPOSABLE) ×2 IMPLANT
STRIP CLOSURE SKIN 1/2X4 (GAUZE/BANDAGES/DRESSINGS) ×2 IMPLANT
SUT MNCRL AB 4-0 PS2 18 (SUTURE) ×2 IMPLANT
SUT SILK 2 0 SH (SUTURE) IMPLANT
SUT VIC AB 2-0 SH 27 (SUTURE) ×1
SUT VIC AB 2-0 SH 27XBRD (SUTURE) ×1 IMPLANT
SUT VIC AB 3-0 SH 27 (SUTURE) ×1
SUT VIC AB 3-0 SH 27X BRD (SUTURE) ×1 IMPLANT
SYR CONTROL 10ML LL (SYRINGE) ×2 IMPLANT
TOWEL GREEN STERILE FF (TOWEL DISPOSABLE) ×2 IMPLANT
TRAY FAXITRON CT DISP (TRAY / TRAY PROCEDURE) ×2 IMPLANT
TUBE CONNECTING 20X1/4 (TUBING) IMPLANT
YANKAUER SUCT BULB TIP NO VENT (SUCTIONS) IMPLANT

## 2019-07-15 NOTE — Anesthesia Postprocedure Evaluation (Signed)
Anesthesia Post Note  Patient: Angel Gaines  Procedure(s) Performed: LEFT BREAST LUMPECTOMY WITH RADIOACTIVE SEED LOCALIZATION (Left Breast)     Patient location during evaluation: PACU Anesthesia Type: General Level of consciousness: awake and alert Pain management: pain level controlled Vital Signs Assessment: post-procedure vital signs reviewed and stable Respiratory status: spontaneous breathing, nonlabored ventilation and respiratory function stable Cardiovascular status: blood pressure returned to baseline and stable Postop Assessment: no apparent nausea or vomiting Anesthetic complications: no    Last Vitals:  Vitals:   07/15/19 0915 07/15/19 0930  BP: 128/76 126/78  Pulse: 62 60  Resp: 13 10  Temp:    SpO2: 100% 100%    Last Pain:  Vitals:   07/15/19 0915  TempSrc:   PainSc: 2                  Lidia Collum

## 2019-07-15 NOTE — Discharge Instructions (Addendum)
[place] Office Phone Number [phone]  BREAST BIOPSY/ PARTIAL MASTECTOMY: POST OP INSTRUCTIONS  Always review your discharge instruction sheet given to you by the facility where your surgery was performed.  IF YOU HAVE DISABILITY OR FAMILY LEAVE FORMS, YOU MUST BRING THEM TO THE OFFICE FOR PROCESSING.  DO NOT GIVE THEM TO YOUR DOCTOR.  1. A prescription for pain medication may be given to you upon discharge.  Take your pain medication as prescribed, if needed.  If narcotic pain medicine is not needed, then you may take acetaminophen (Tylenol) or ibuprofen (Advil) as needed. 2. Take your usually prescribed medications unless otherwise directed 3. If you need a refill on your pain medication, please contact your pharmacy.  They will contact our office to request authorization.  Prescriptions will not be filled after 5pm or on week-ends. 4. You should eat very light the first 24 hours after surgery, such as soup, crackers, pudding, etc.  Resume your normal diet the day after surgery. 5. Most patients will experience some swelling and bruising in the breast.  Ice packs and a good support bra will help.  Swelling and bruising can take several days to resolve.  6. It is common to experience some constipation if taking pain medication after surgery.  Increasing fluid intake and taking a stool softener will usually help or prevent this problem from occurring.  A mild laxative (Milk of Magnesia or Miralax) should be taken according to package directions if there are no bowel movements after 48 hours. 7. Unless discharge instructions indicate otherwise, you may remove your bandages 48 hours after surgery, and you may shower at that time.  You may have steri-strips (small skin tapes) in place directly over the incision.  These strips should be left on the skin for 7-10 days.   Any sutures or staples will be removed at the office during your follow-up visit. 8. ACTIVITIES:  You may resume  regular daily activities (gradually increasing) beginning the next day.  Wearing a good support bra or sports bra (or the breast binder) minimizes pain and swelling.  You may have sexual intercourse when it is comfortable. a. You may drive when you no longer are taking prescription pain medication, you can comfortably wear a seatbelt, and you can safely maneuver your car and apply brakes. b. RETURN TO WORK:  __________1 week_______________ 9. You should see your doctor in the office for a follow-up appointment approximately two weeks after your surgery.  Your doctors nurse will typically make your follow-up appointment when she calls you with your pathology report.  Expect your pathology report 2-3 business days after your surgery.  You may call to check if you do not hear from [place] after three days.   WHEN TO CALL YOUR DOCTOR: 1. Fever over 101.0 2. Nausea and/or vomiting. 3. Extreme swelling or bruising. 4. Continued bleeding from incision. 5. Increased pain, redness, or drainage from the incision.  The clinic staff is available to answer your questions during regular business hours.  Please dont hesitate to call and ask to speak to one of the nurses for clinical concerns.  If you have a medical emergency, go to the nearest emergency room or call 911.  A surgeon from [facility] is always on call at the hospital.  For further questions, please visit [email or web address]    You had tylenol during your surgery.  Do not take any tylenol until 1pm today  You had toradol during your surgery.  Do not take  any ibuprofen products until after 2:30pm today.    Post Anesthesia Home Care Instructions  Activity: Get plenty of rest for the remainder of the day. A responsible individual must stay with you for 24 hours following the procedure.  For the next 24 hours, DO NOT: -Drive a car -Paediatric nurse -Drink alcoholic beverages -Take any medication unless instructed by your  physician -Make any legal decisions or sign important papers.  Meals: Start with liquid foods such as gelatin or soup. Progress to regular foods as tolerated. Avoid greasy, spicy, heavy foods. If nausea and/or vomiting occur, drink only clear liquids until the nausea and/or vomiting subsides. Call your physician if vomiting continues.  Special Instructions/Symptoms: Your throat may feel dry or sore from the anesthesia or the breathing tube placed in your throat during surgery. If this causes discomfort, gargle with warm salt water. The discomfort should disappear within 24 hours.  If you had a scopolamine patch placed behind your ear for the management of post- operative nausea and/or vomiting:  1. The medication in the patch is effective for 72 hours, after which it should be removed.  Wrap patch in a tissue and discard in the trash. Wash hands thoroughly with soap and water. 2. You may remove the patch earlier than 72 hours if you experience unpleasant side effects which may include dry mouth, dizziness or visual disturbances. 3. Avoid touching the patch. Wash your hands with soap and water after contact with the patch.

## 2019-07-15 NOTE — Interval H&P Note (Signed)
History and Physical Interval Note:  07/15/2019 7:34 AM  Angel Gaines  has presented today for surgery, with the diagnosis of LEFT BREAST CANCER.  The various methods of treatment have been discussed with the patient and family. After consideration of risks, benefits and other options for treatment, the patient has consented to  Procedure(s): LEFT BREAST LUMPECTOMY WITH RADIOACTIVE SEED LOCALIZATION (Left) as a surgical intervention.  The patient's history has been reviewed, patient examined, no change in status, stable for surgery.  I have reviewed the patient's chart and labs.  Questions were answered to the patient's satisfaction.     Stark Klein

## 2019-07-15 NOTE — Anesthesia Procedure Notes (Signed)
Procedure Name: LMA Insertion Date/Time: 07/15/2019 7:53 AM Performed by: Gwyndolyn Saxon, CRNA Pre-anesthesia Checklist: Patient identified, Emergency Drugs available, Suction available and Patient being monitored Patient Re-evaluated:Patient Re-evaluated prior to induction Oxygen Delivery Method: Circle system utilized Preoxygenation: Pre-oxygenation with 100% oxygen Induction Type: IV induction Ventilation: Mask ventilation without difficulty LMA: LMA inserted LMA Size: 3.0 Number of attempts: 1 Placement Confirmation: positive ETCO2 and breath sounds checked- equal and bilateral Tube secured with: Tape Dental Injury: Teeth and Oropharynx as per pre-operative assessment

## 2019-07-15 NOTE — Anesthesia Preprocedure Evaluation (Addendum)
Anesthesia Evaluation  Patient identified by MRN, date of birth, ID band Patient awake    Reviewed: Allergy & Precautions, NPO status , Patient's Chart, lab work & pertinent test results  History of Anesthesia Complications Negative for: history of anesthetic complications  Airway Mallampati: III  TM Distance: >3 FB Neck ROM: Full    Dental  (+) Teeth Intact   Pulmonary neg pulmonary ROS,    Pulmonary exam normal        Cardiovascular negative cardio ROS Normal cardiovascular exam     Neuro/Psych negative neurological ROS  negative psych ROS   GI/Hepatic negative GI ROS, Neg liver ROS,   Endo/Other  negative endocrine ROS  Renal/GU negative Renal ROS  negative genitourinary   Musculoskeletal negative musculoskeletal ROS (+)   Abdominal   Peds  Hematology negative hematology ROS (+)   Anesthesia Other Findings Breast cancer  Reproductive/Obstetrics                            Anesthesia Physical Anesthesia Plan  ASA: II  Anesthesia Plan: General   Post-op Pain Management:    Induction: Intravenous  PONV Risk Score and Plan: 3 and Ondansetron, Dexamethasone, Midazolam and Treatment may vary due to age or medical condition  Airway Management Planned: LMA  Additional Equipment: None  Intra-op Plan:   Post-operative Plan: Extubation in OR  Informed Consent: I have reviewed the patients History and Physical, chart, labs and discussed the procedure including the risks, benefits and alternatives for the proposed anesthesia with the patient or authorized representative who has indicated his/her understanding and acceptance.     Dental advisory given  Plan Discussed with:   Anesthesia Plan Comments:        Anesthesia Quick Evaluation

## 2019-07-15 NOTE — Op Note (Signed)
Left Breast Radioactive seed localized lumpectomy  Indications: This patient presents with history of left breast cancer, upper outer quadrant, cTis, +/+  Pre-operative Diagnosis: left breast cancer  Post-operative Diagnosis: Same  Surgeon: Stark Klein   Anesthesia: General endotracheal anesthesia  ASA Class: 2  Procedure Details  The patient was seen in the Holding Room. The risks, benefits, complications, treatment options, and expected outcomes were discussed with the patient. The possibilities of bleeding, infection, the need for additional procedures, failure to diagnose a condition, and creating a complication requiring transfusion or operation were discussed with the patient. The patient concurred with the proposed plan, giving informed consent.  The site of surgery properly noted/marked. The patient was taken to Operating Room # 7, identified, and the procedure verified as Left Breast seed localized Lumpectomy. A Time Out was held and the above information confirmed.  The left breast and chest were prepped and draped in standard fashion. The lumpectomy was performed by creating a transverse incision laterally near the previously placed radioactive seed.  Dissection was carried down to around the point of maximum signal intensity. The cautery was used to perform the dissection.  Hemostasis was achieved with cautery. The edges of the cavity were marked with large clips, with one each medial, lateral, inferior and superior, and two clips posteriorly.   The specimen was inked with the margin marker paint kit.    Specimen radiography confirmed inclusion of the mammographic lesion, the clip, and the seed.  The background signal in the breast was zero.  The wound was irrigated and closed with 3-0 vicryl in layers and 4-0 monocryl subcuticular suture.      Sterile dressings were applied. At the end of the operation, all sponge, instrument, and needle counts were correct.  Findings: grossly  clear surgical margins and no adenopathy  Estimated Blood Loss:  min         Specimens: left breast lumpectomy with seed         Complications:  None; patient tolerated the procedure well.         Disposition: PACU - hemodynamically stable.         Condition: stable

## 2019-07-15 NOTE — Transfer of Care (Signed)
Immediate Anesthesia Transfer of Care Note  Patient: Angel Gaines  Procedure(s) Performed: LEFT BREAST LUMPECTOMY WITH RADIOACTIVE SEED LOCALIZATION (Left Breast)  Patient Location: PACU  Anesthesia Type:General  Level of Consciousness: awake and alert   Airway & Oxygen Therapy: Patient Spontanous Breathing and Patient connected to face mask oxygen  Post-op Assessment: Report given to RN and Post -op Vital signs reviewed and stable  Post vital signs: Reviewed and stable  Last Vitals:  Vitals Value Taken Time  BP    Temp    Pulse    Resp    SpO2      Last Pain:  Vitals:   07/15/19 0636  TempSrc: Oral  PainSc: 0-No pain         Complications: No apparent anesthesia complications

## 2019-07-15 NOTE — H&P (Signed)
Angel Gaines Location: [facility] Surgery Patient #: [id number] DOB: [birth year] Single / Language: Angel Gaines / Race: White Female   History of Present Illness The patient is a 63 year old female who presents with breast cancer. Pt is a 63 yo F referred by Dr Luan Pulling for a new diagnosis of left breast cancer 05/2019. The patient presented with 4 mm of screening detected calcifications in the upper outer quadrant on the left. She underwent core needle biopsy and was seen to have DCIS, high grade, ER/PR positive. She has never needed a breast biopsy before. She has no family history of breast cancer.   Mammogram from [facility].   pathology 05/19/2019 Breast, left, needle core biopsy, calcs UOQ 7cmfn - DUCTAL CARCINOMA IN SITU WITH CALCIFICATIONS. - FIBROCYSTIC CHANGES WITH CALCIFICATIONS. - SEE COMMENT. Microscopic Comment The carcinoma appears high grade. IMMUNOHISTOCHEMICAL AND MORPHOMETRIC ANALYSIS PERFORMED MANUALLY Estrogen Receptor: 100%, POSITIVE, STRONG STAINING INTENSITY Progesterone Receptor: 60%, POSITIVE, MODERATE-WEAK STAINING INTENSITY   Diagnostic Studies History Colonoscopy  5-10 years ago Mammogram  within last year Pap Smear  1-5 years ago  Allergies No Known Drug Allergies   Medication History  Fish Oil (1000MG  Capsule, Oral) Active. Multivitamin (Oral) Active. Vitamin D (50 MCG(2000 UT) Tablet, Oral) Active. Omega 3 (1000MG  Capsule, Oral) Active. Aspirin (81MG  Tablet, Oral) Active. Medications Reconciled  Social History Alcohol use  Occasional alcohol use. Caffeine use  Coffee, Tea. Tobacco use  Never smoker.  Family History Bleeding disorder  Father. Cerebrovascular Accident  Father. Thyroid problems  Sister.  Pregnancy / Birth History Age at menarche  78 years. Age of menopause  [age]    Review of Systems  Respiratory Not Present- Bloody sputum, Chronic Cough, Difficulty Breathing, Snoring and Wheezing. Breast Present-  Breast Mass. Not Present- Breast Pain, Nipple Discharge and Skin Changes. Female Genitourinary Not Present- Frequency, Nocturia, Painful Urination, Pelvic Pain and Urgency.  Vitals Weight: 150.4 lb Height: 62in Body Surface Area: 1.69 m Body Mass Index: 27.51 kg/m  Pulse: 79 (Regular)  BP: 140/70(Sitting, Left Arm, Standard)       Physical Exam  General Mental Status-Alert. General Appearance-Consistent with stated age. Hydration-Well hydrated. Voice-Normal.  Head and Neck Head-normocephalic, atraumatic with no lesions or palpable masses. Trachea-midline. Thyroid Gland Characteristics - normal size and consistency.  Eye Eyeball - Bilateral-Extraocular movements intact. Sclera/Conjunctiva - Bilateral-No scleral icterus.  Chest and Lung Exam Chest and lung exam reveals -quiet, even and easy respiratory effort with no use of accessory muscles and on auscultation, normal breath sounds, no adventitious sounds and normal vocal resonance. Inspection Chest Wall - Normal. Back - normal.  Breast Note: breasts are relatively symmetric. They are still fairly dense. No palpable masses or LAD. no nipple retraction or skin dimpling. no nipple discharge. some bruising laterally on left breast   Cardiovascular Cardiovascular examination reveals -normal heart sounds, regular rate and rhythm with no murmurs and normal pedal pulses bilaterally.  Abdomen Inspection Inspection of the abdomen reveals - No Hernias. Palpation/Percussion Palpation and Percussion of the abdomen reveal - Soft, Non Tender, No Rebound tenderness, No Rigidity (guarding) and No hepatosplenomegaly. Auscultation Auscultation of the abdomen reveals - Bowel sounds normal.  Neurologic Neurologic evaluation reveals -alert and oriented x 3 with no impairment of recent or remote memory. Mental Status-Normal.  Musculoskeletal Global Assessment -Note: no gross  deformities.  Normal Exam - Left-Upper Extremity Strength Normal and Lower Extremity Strength Normal. Normal Exam - Right-Upper Extremity Strength Normal and Lower Extremity Strength Normal.  Lymphatic Head & Neck  General Head & Neck Lymphatics: Bilateral - Description - Normal. Axillary  General Axillary Region: Bilateral - Description - Normal. Tenderness - Non Tender. Femoral & Inguinal  Generalized Femoral & Inguinal Lymphatics: Bilateral - Description - No Generalized lymphadenopathy.    Assessment & Plan  MALIGNANT NEOPLASM OF UPPER-OUTER QUADRANT OF LEFT BREAST IN FEMALE, ESTROGEN RECEPTOR POSITIVE (C50.412) Impression: Pt will need seed localized lumpectomy. I will also refer to medical and radiation oncology. Discussed with patient in person and sister by phone.  The surgical procedure was described to the patient. I discussed the incision type and location and that we would need radiology involved on with a wire or seed marker and/or sentinel node.  The risks and benefits of the procedure were described to the patient and she wishes to proceed.  We discussed the risks bleeding, infection, damage to other structures, need for further procedures/surgeries. We discussed the risk of seroma. The patient was advised if the area in the breast in cancer, we may need to go back to surgery for additional tissue to obtain negative margins or for a lymph node biopsy. The patient was advised that these are the most common complications, but that others can occur as well. They were advised against taking aspirin or other anti-inflammatory agents/blood thinners the week before surgery.  Current Plans Referred to Radiation Oncology, for evaluation and follow up (Radiation Oncology). Routine. Referred to Oncology, for evaluation and follow up (Oncology). Routine. Pt Education - [facility] breast cancer surgery: discussed with patient and provided information. You are being scheduled for  surgery- Our schedulers will call you.  You should hear from our office's scheduling department within 5 working days about the location, date, and time of surgery. We try to make accommodations for patient's preferences in scheduling surgery, but sometimes the OR schedule or the surgeon's schedule prevents [place] from making those accommodations.  If you have not heard from our office [phone]) in 5 working days, call the office and ask for your surgeon's nurse.  If you have other questions about your diagnosis, plan, or surgery, call the office and ask for your surgeon's nurse.    Signed by Stark Klein, MD

## 2019-07-16 ENCOUNTER — Encounter (HOSPITAL_BASED_OUTPATIENT_CLINIC_OR_DEPARTMENT_OTHER): Payer: Self-pay | Admitting: General Surgery

## 2019-07-17 NOTE — Progress Notes (Signed)
Please let patient know no residual cancer.

## 2019-07-29 ENCOUNTER — Telehealth: Payer: 59

## 2019-08-02 ENCOUNTER — Telehealth: Payer: Self-pay | Admitting: Radiation Oncology

## 2019-08-03 ENCOUNTER — Telehealth: Payer: Self-pay | Admitting: Radiation Oncology

## 2019-08-03 ENCOUNTER — Ambulatory Visit
Admission: RE | Admit: 2019-08-03 | Discharge: 2019-08-03 | Disposition: A | Discharge status: Home / Self Care | Payer: 59 | Source: Ambulatory Visit | Attending: Radiation Oncology | Admitting: Radiation Oncology

## 2019-08-03 ENCOUNTER — Encounter: Payer: Self-pay | Admitting: *Deleted

## 2019-08-03 ENCOUNTER — Other Ambulatory Visit: Payer: Self-pay

## 2019-08-03 ENCOUNTER — Other Ambulatory Visit: Payer: Self-pay | Admitting: Radiation Oncology

## 2019-08-03 DIAGNOSIS — C50412 Malignant neoplasm of upper-outer quadrant of left female breast: Secondary | ICD-10-CM

## 2019-08-03 NOTE — Progress Notes (Signed)
Radiation Oncology         [phone] ________________________________  Name: Angel Gaines        MRN: [id number]  Date of Service: 08/03/2019 DOB: Dec 20, 1955  CC:Sun, Gari Crown, MD  Donald Prose, MD     REFERRING PHYSICIAN: Donald Prose, MD   DIAGNOSIS: The encounter diagnosis was Malignant neoplasm of upper-outer quadrant of left breast in female, estrogen receptor positive ([place]).   HISTORY OF PRESENT ILLNESS: Angel Gaines is a 63 y.o. female with a newly diagnosed left sided DCIS. The patient went for screening mammogram that revealed a 5 mm group of calcifications in the upper outer quadrant of the left breast. She underwent diagnostic ultrasound of the axilla which was negative for disease. A stereotactic biopsy on 05/19/2019 revealing a high grade DCIS that was ER/PR positive. She is scheduled to meet with Dr. Jana Hakim next week, and is scheduled to undergo lumpectomy on 07/15/2019.  She was seen in June to discuss options of treatment, and subsequently has undergone left lumpectomy on 07/15/2019.  Fortunately no residual disease was identified and they were able to compare with her prior biopsy specimen stating that there was a 1 mm focus of high-grade DCIS within the specimen that was ER/PR positive consistent with the initial biopsy findings.  She is seen today to discuss options of adjuvant therapy.   PREVIOUS RADIATION THERAPY: No   PAST MEDICAL HISTORY:  Past Medical History:  Diagnosis Date  . Family history of breast cancer in female   . Family history of cervical cancer   . Family history of lung cancer   . Pressure in head        PAST SURGICAL HISTORY: Past Surgical History:  Procedure Laterality Date  . BREAST LUMPECTOMY WITH RADIOACTIVE SEED LOCALIZATION Left 07/15/2019   Procedure: LEFT BREAST LUMPECTOMY WITH RADIOACTIVE SEED LOCALIZATION;  Surgeon: Stark Klein, MD;  Location: [facility];  Service: General;  Laterality: Left;     FAMILY HISTORY:   Family History  Problem Relation Age of Onset  . Sarcoidosis Mother   . Lung cancer Father 9       Smoker  . Breast cancer Maternal Grandfather 27  . Cervical cancer Maternal Aunt 68     SOCIAL HISTORY:  reports that she has never smoked. She has never used smokeless tobacco. She reports current alcohol use of about 2.0 standard drinks of alcohol per week. She reports that she does not use drugs.  The patient is single.  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  She enjoys traveling.   ALLERGIES: Patient has no known allergies.   MEDICATIONS:  Current Outpatient Medications  Medication Sig Dispense Refill  . aspirin EC 81 MG tablet Take 81 mg by mouth daily.    . Cholecalciferol (VITAMIN D) 2000 units CAPS Take 2,000 Units by mouth daily.    . Multiple Vitamin (MULTIVITAMIN) tablet Take 1 tablet by mouth daily.    . Omega-3 Fatty Acids (FISH OIL PO) Take by mouth.    . oxyCODONE (OXY IR/ROXICODONE) 5 MG immediate release tablet Take 0.5-1 tablets (2.5-5 mg total) by mouth every 4 (four) hours as needed for severe pain. 10 tablet 0   No current facility-administered medications for this encounter.      REVIEW OF SYSTEMS: On review of systems, the patient reports that she is doing well overall. She has some fullness in the left breast at the surgical site. She has some tenderness as well. denies any  chest pain, shortness of breath, cough, fevers, chills, night sweats, unintended weight changes. She denies any bowel or bladder disturbances, and denies abdominal pain, nausea or vomiting. She denies any new musculoskeletal or joint aches or pains, new skin lesions or concerns. A complete review of systems is obtained and is otherwise negative.   PHYSICAL EXAM:  Unable to assess due to encounter type In general this is a well appearing caucasian female in no acute distress. She's alert and oriented x4 and appropriate throughout the examination. Cardiopulmonary assessment is negative for  acute distress and she exhibits normal effort. Breast exam is deferred.   ECOG = 0  0 - Asymptomatic (Fully active, able to carry on all predisease activities without restriction)  1 - Symptomatic but completely ambulatory (Restricted in physically strenuous activity but ambulatory and able to carry out work of a light or sedentary nature. For example, light housework, office work)  2 - Symptomatic, <50% in bed during the day (Ambulatory and capable of all self care but unable to carry out any work activities. Up and about more than 50% of waking hours)  3 - Symptomatic, >50% in bed, but not bedbound (Capable of only limited self-care, confined to bed or chair 50% or more of waking hours)  4 - Bedbound (Completely disabled. Cannot carry on any self-care. Totally confined to bed or chair)  5 - Death   Eustace Pen MM, Creech RH, Tormey DC, et al. [phone]). "Toxicity and response criteria of the [facility]". [place] Oncol. 5 (6): 649-55    LABORATORY DATA:  No results found for: WBC, HGB, HCT, MCV, PLT No results found for: NA, K, CL, CO2 No results found for: ALT, AST, GGT, ALKPHOS, BILITOT    RADIOGRAPHY: No results found.     IMPRESSION/PLAN: 1. High Grade ER/PR positive DCIS of the left breast. Dr. Lisbeth Renshaw discusses the finaly pathology findings and reviews the nature of noninvasive left breast disease.  We discussed that even though her final pathology was extremely reassuring, treatment recommendations are based on pre-operative findings, and that there would still be a benefit in reducing the risk of local recurrence.  She would also be benefited by adjuvant antiestrogen therapy to reduce the risk of recurrence. We discussed the risks, benefits, short, and long term effects of radiotherapy, and the patient is interested in proceeding. Dr. Lisbeth Renshaw discusses the delivery and logistics of radiotherapy and anticipates a course of 4 weeks of radiotherapy.  She will come  today to consider simulation but may need PT eval or visit with Dr. Barry Dienes to consider seroma aspiration and will sign consent to proceed at that time.  This encounter was provided by telemedicine platform Mychart.  The patient has given verbal consent for this type of encounter and has been advised to only accept a meeting of this type in a secure network environment. The time spent during this encounter was 25 minutes. The attendants for this meeting include Ernestina Patches, RN, Dr. Lisbeth Renshaw, Hayden Pedro  and Jeannene Patella.  During the encounter,  Ernestina Patches RN, Dr. Lisbeth Renshaw, and Hayden Pedro were located at [facility] Radiation Oncology Department.  Angel Gaines was located at home.    The above documentation reflects my direct findings during this shared patient visit. Please see the separate note by Dr. Lisbeth Renshaw on this date for the remainder of the patient's plan of care.    Carola Rhine, PAC

## 2019-08-03 NOTE — Progress Notes (Signed)
Patient states she had a lumpectomy on 06/14/2019. Seen by the surgeon yesterday has some fluid in her breast that is causing some discomfort

## 2019-08-03 NOTE — Telephone Encounter (Signed)
Pt verbalized questions to the staff about additional PT evaluation. I called her back and left her a message about the role of PT and to call if she still had questions.

## 2019-08-03 NOTE — Patient Instructions (Signed)
Coronavirus (COVID-19) Are you at risk?  Are you at risk for the Coronavirus (COVID-19)?  To be considered HIGH RISK for Coronavirus (COVID-19), you have to meet the following criteria:  . Traveled to [place], [place], [place], [place] or [place]; or in the [place] to [place], [place], [place], or [place]; and have fever, cough, and shortness of breath within the last 2 weeks of travel OR . Been in close contact with a person diagnosed with COVID-19 within the last 2 weeks and have fever, cough, and shortness of breath . IF YOU DO NOT MEET THESE CRITERIA, YOU ARE CONSIDERED LOW RISK FOR COVID-19.  What to do if you are HIGH RISK for COVID-19?  . If you are having a medical emergency, call 911. . Seek medical care right away. Before you go to a doctor's office, urgent care or emergency department, call ahead and tell them about your recent travel, contact with someone diagnosed with COVID-19, and your symptoms. You should receive instructions from your physician's office regarding next steps of care.  . When you arrive at healthcare provider, tell the healthcare staff immediately you have returned from visiting [place], [place], [place], [place] or [place]; or traveled in the [place] to [place], [place], [place], or [place]; in the last two weeks or you have been in close contact with a person diagnosed with COVID-19 in the last 2 weeks.   . Tell the health care staff about your symptoms: fever, cough and shortness of breath. . After you have been seen by a medical provider, you will be either: o Tested for (COVID-19) and discharged home on quarantine except to seek medical care if symptoms worsen, and asked to  - Stay home and avoid contact with others until you get your results (4-5 days)  - Avoid travel on public transportation if possible (such as bus, train, or airplane) or o Sent to the Emergency Department by EMS for evaluation, COVID-19 testing, and possible  admission depending on your condition and test results.  What to do if you are LOW RISK for COVID-19?  Reduce your risk of any infection by using the same precautions used for avoiding the common cold or flu:  . Wash your hands often with soap and warm water for at least 20 seconds.  If soap and water are not readily available, use an alcohol-based hand sanitizer with at least 60% alcohol.  . If coughing or sneezing, cover your mouth and nose by coughing or sneezing into the elbow areas of your shirt or coat, into a tissue or into your sleeve (not your hands). . Avoid shaking hands with others and consider head nods or verbal greetings only. . Avoid touching your eyes, nose, or mouth with unwashed hands.  . Avoid close contact with people who are sick. . Avoid places or events with large numbers of people in one location, like concerts or sporting events. . Carefully consider travel plans you have or are making. . If you are planning any travel outside or inside the [place], visit the CDC's Travelers' Health webpage for the latest health notices. . If you have some symptoms but not all symptoms, continue to monitor at home and seek medical attention if your symptoms worsen. . If you are having a medical emergency, call 911.   ADDITIONAL HEALTHCARE OPTIONS FOR PATIENTS   Telehealth / e-Visit: [email or web address]         [facility]: [phone]  [facility]: [phone]                   [facility]: [phone]   

## 2019-08-05 ENCOUNTER — Ambulatory Visit: Payer: 59 | Attending: Radiation Oncology | Admitting: Rehabilitation

## 2019-08-05 ENCOUNTER — Encounter: Payer: Self-pay | Admitting: Rehabilitation

## 2019-08-05 ENCOUNTER — Other Ambulatory Visit: Payer: Self-pay

## 2019-08-05 DIAGNOSIS — R600 Localized edema: Secondary | ICD-10-CM | POA: Diagnosis present

## 2019-08-05 DIAGNOSIS — N644 Mastodynia: Secondary | ICD-10-CM

## 2019-08-05 NOTE — Patient Instructions (Signed)
Manual Lymph Drainage for Left Breast.  Do daily.  Do slowly. Use flat hands with just enough pressure to stretch the skin. Do not slide over the skin, but move the skin with the hand you're using. Lie down or sit comfortably (in a recliner, for example) to do this.  1) Hug yourself:  cross arms and do circles at collar bones near neck 5-7 times (to "wake up" lots of lymph nodes in this area). 2) Take slow deep breaths, allowing your belly to balloon out as your breathe in, 5x (to "wake up" abdominal lymph nodes to take on extra fluid). 3) Left armpit-stretch skin in small circles to stimulate intact lymph nodes there, 5-7x. 4) Then work on the swollen area of the left breast using gentle circular motions to move the swelling to the armpit bucket   [facility] [address] [place]   [address] [phone]

## 2019-08-05 NOTE — Therapy (Signed)
[place], [place], [address] Phone: [phone]   Fax:  [phone]  Physical Therapy Evaluation  Patient Details  Name: Angel Gaines MRN: [id number] Date of Birth: 1956-08-06 Referring Provider (PT): Angel Simpson PA-C   Encounter Date: 08/05/2019  PT End of Session - 08/05/19 0915    Visit Number  1    Number of Visits  5    Date for PT Re-Evaluation  09/02/19       Past Medical History:  Diagnosis Date  . Family history of breast cancer in female   . Family history of cervical cancer   . Family history of lung cancer   . Pressure in head     Past Surgical History:  Procedure Laterality Date  . BREAST LUMPECTOMY WITH RADIOACTIVE SEED LOCALIZATION Left 07/15/2019   Procedure: LEFT BREAST LUMPECTOMY WITH RADIOACTIVE SEED LOCALIZATION;  Surgeon: Angel Klein, MD;  Location: [facility];  Service: General;  Laterality: Left;    There were no vitals filed for this visit.   Subjective Assessment - 08/05/19 0829    Subjective  tenderness and discomfort in the Lt breast after lumpectomy.  Feels better with bra.  My arm feels ok.    Pertinent History  Left lumpectomy 07/15/19 by Dr. Barry Gaines due to ER positive DCIS. Radiation x 4 weeks to follow with antiestrogens.  Other health history unremarkable.    Limitations  Other (comment)   nothing   Patient Stated Goals  see if there is anything for the breast    Currently in Pain?  No/denies   Lt breast tenderness with touching and at the end of the day   Pain Score  --   up to 3/10   Pain Location  Breast    Pain Orientation  Left    Pain Descriptors / Indicators  Tender    Pain Type  Surgical pain         [facility] PT Assessment - 08/05/19 0001      Assessment   Medical Diagnosis  Lt breast edema    Referring Provider (PT)  Angel Simpson PA-C    Onset Date/Surgical Date  07/15/19    Hand Dominance  Right      Precautions    Precaution Comments  lymphedema      Restrictions   Weight Bearing Restrictions  No      Balance Screen   Has the patient fallen in the past 6 months  No    Has the patient had a decrease in activity level because of a fear of falling?   No    Is the patient reluctant to leave their home because of a fear of falling?   No      Home Film/video editor residence    Living Arrangements  Spouse/significant other      Prior Function   Level of Independence  Independent    Vocation  Full time employment    Vocation Requirements  computer    Leisure  walking and pool      Cognition   Overall Cognitive Status  Within Functional Limits for tasks assessed      Observation/Other Assessments   Observations  steristrips still intact Lt lateral breast with bruising and evident still healing incision      Sensation   Light Touch  Appears Intact    Additional Comments  only tender right near incision; no evidence of seroma  but possible it is Engineer, mining Movements are Fluid and Coordinated  Yes      Posture/Postural Control   Posture/Postural Control  Postural limitations    Postural Limitations  Rounded Shoulders;Forward head      ROM / Strength   AROM / PROM / Strength  AROM      AROM   AROM Assessment Site  Shoulder    Right/Left Shoulder  Right;Left    Right Shoulder Flexion  160 Degrees    Right Shoulder ABduction  165 Degrees    Right Shoulder Internal Rotation  82 Degrees    Right Shoulder External Rotation  83 Degrees    Left Shoulder Flexion  162 Degrees    Left Shoulder ABduction  165 Degrees    Left Shoulder Internal Rotation  82 Degrees    Left Shoulder External Rotation  84 Degrees        LYMPHEDEMA/ONCOLOGY QUESTIONNAIRE - 08/05/19 0913      Type   Cancer Type  DCIS      Surgeries   Lumpectomy Date  07/15/19      Treatment   Active Chemotherapy Treatment  No    Past Chemotherapy Treatment  No    Active  Radiation Treatment  No    Past Radiation Treatment  No    Current Hormone Treatment  No    Past Hormone Therapy  Yes          Angel Gaines - 08/05/19 0001    Open a tight or new jar  No difficulty    Do heavy household chores (wash walls, wash floors)  No difficulty    Carry a shopping bag or briefcase  No difficulty    Wash your back  No difficulty    Use a knife to cut food  No difficulty    Recreational activities in which you take some force or impact through your arm, shoulder, or hand (golf, hammering, tennis)  Mild difficulty    During the past week, to what extent has your arm, shoulder or hand problem interfered with your normal social activities with family, friends, neighbors, or groups?  Not at all    During the past week, to what extent has your arm, shoulder or hand problem limited your work or other regular daily activities  Not at all    Arm, shoulder, or hand pain.  None    Tingling (pins and needles) in your arm, shoulder, or hand  None    Difficulty Sleeping  Mild difficulty    DASH Score  4.55 %        Objective measurements completed on examination: See above findings.      [facility] PT Treatment/Exercise - 08/05/19 0001      Manual Therapy   Manual Therapy  Edema management;Manual Lymphatic Drainage (MLD)    Edema Management  pt wearing appropriate compression zip front bra.  Educated pt on where to get more bras for radiation or compression if needed.  Made 1/2" gray foam rectangle for lateral chest area    Manual Lymphatic Drainage (MLD)  in seated education on MLD basics and lymphatic basics.  Instructed to perform lt armpit circles x 10 then work from lateral breast towards the armpit 2-3x per day 5-41minutes             PT Education - 08/05/19 0914    Education Details  MLD, post op swelling, foam and bra use  Person(s) Educated  Patient    Methods  Explanation    Comprehension  Verbalized understanding;Returned demonstration;Verbal cues  required;Tactile cues required          PT Long Term Goals - 08/05/19 0919      PT LONG TERM GOAL #1   Title  Pt will be educated on self MLD for the left breast    Time  1    Period  Days    Status  Achieved      PT LONG TERM GOAL #2   Title  Pt will be ready for radiation with less edema and breast pain    Time  3    Period  Weeks    Status  New      PT LONG TERM GOAL #3   Title  pt will be educated on self care and mobility during radiation to decrease effects    Time  3    Period  Weeks    Status  New             Plan - 08/05/19 0915    Clinical Impression Statement  Pt presents with mild edema post surgically in the left lateral breast surrounding the incision.  Pt reports it is starting to feel better since her MD visit already.  Incision still appears inflamed and bruised with no fibrosis or thickness evident.  Pt was given self care MLD, foam and bra use to attempt x 1 week with instruction to return if pt feels no progress is being made.  Shoulder ROM is full and equal but pt educated on postural awareness and activity during radiation to decrease shoulder dysfunction.    Examination-Participation Restrictions  Cleaning;Yard Work    Stability/Clinical Decision Making  Stable/Uncomplicated    Designer, jewellery  Low    Rehab Potential  Excellent    PT Frequency  2x / week    PT Duration  3 weeks    PT Treatment/Interventions  ADLs/Self Care Home Management;Patient/family education;Manual lymph drainage;Manual techniques;Taping;Passive range of motion;Therapeutic exercise    PT Next Visit Plan  if pt returns continue MLD for the left breast with all lymph nodes intact.  tape? how is foam? better compression bra?    Consulted and Agree with Plan of Care  Patient       Patient will benefit from skilled therapeutic intervention in order to improve the following deficits and impairments:  Pain, Increased edema, Decreased skin integrity  Visit  Diagnosis: Localized edema  Pain of left breast     Problem List Patient Active Problem List   Diagnosis Date Noted  . Genetic testing 06/28/2019  . Family history of breast cancer in female   . Family history of cervical cancer   . Family history of lung cancer   . Malignant neoplasm of upper-outer quadrant of left breast in female, estrogen receptor positive ([place]) 05/26/2019  . Morning headache 02/03/2017  . Hearing loss 02/03/2017  . Plantar fasciitis 09/24/2016  . Leg length inequality 09/24/2016    Angel Bray 08/05/2019, 9:21 AM  [place], [place], [address] Phone: [phone]   Fax:  [phone]  Name: Angel Gaines MRN: [id number] Date of Birth: [birth year]

## 2019-08-12 ENCOUNTER — Telehealth: Payer: Self-pay | Admitting: Rehabilitation

## 2019-08-12 NOTE — Telephone Encounter (Signed)
Called pt to see how PT self care was going and to follow up for any additional needs.  Pt reports that the swelling is getting better and better and that she feels ready to go for radiation simulation next week without further appointments.  Pt aware to let us know if anything else arises.    Shan Levans, PT

## 2019-08-19 ENCOUNTER — Other Ambulatory Visit: Payer: Self-pay

## 2019-08-19 ENCOUNTER — Ambulatory Visit
Admission: RE | Admit: 2019-08-19 | Discharge: 2019-08-19 | Disposition: A | Discharge status: Home / Self Care | Payer: 59 | Source: Ambulatory Visit | Attending: Radiation Oncology | Admitting: Radiation Oncology

## 2019-08-19 DIAGNOSIS — C50412 Malignant neoplasm of upper-outer quadrant of left female breast: Secondary | ICD-10-CM

## 2019-08-19 DIAGNOSIS — Z170 Estrogen receptor positive status [ER+]: Secondary | ICD-10-CM | POA: Diagnosis present

## 2019-08-22 NOTE — Progress Notes (Signed)
  Radiation Oncology         [phone] ________________________________  Name: OMOLARA KAMDAR MRN: [id number]  Date: 08/19/2019  DOB: 06-20-1956  Optical Surface Tracking Plan:  Since intensity modulated radiotherapy (IMRT) and 3D conformal radiation treatment methods are predicated on accurate and precise positioning for treatment, intrafraction motion monitoring is medically necessary to ensure accurate and safe treatment delivery.  The ability to quantify intrafraction motion without excessive ionizing radiation dose can only be performed with optical surface tracking. Accordingly, surface imaging offers the opportunity to obtain 3D measurements of patient position throughout IMRT and 3D treatments without excessive radiation exposure.  I am ordering optical surface tracking for this patient's upcoming course of radiotherapy. ________________________________  Kyung Rudd, MD 08/22/2019 2:12 PM    Reference:   Particia Jasper, et al. Surface imaging-based analysis of intrafraction motion for breast radiotherapy patients.Journal of [place], n. 6, nov. 2014. ISSN [id number].   Available at: <http://www.jacmp.org/index.php/jacmp/article/view/4957>.

## 2019-08-22 NOTE — Progress Notes (Signed)
  Radiation Oncology         [phone] ________________________________  Name: Angel Gaines MRN: [id number]  Date: 08/19/2019  DOB: 01-May-1956   DIAGNOSIS:     ICD-10-CM   1. Malignant neoplasm of upper-outer quadrant of left breast in female, estrogen receptor positive ([place])  C50.412    Z17.0     SIMULATION AND TREATMENT PLANNING NOTE  The patient presented for simulation prior to beginning her course of radiation treatment for her diagnosis of left-sided breast cancer. The patient was placed in a supine position on a breast board. A customized vac-lock bag was constructed and this complex treatment device will be used on a daily basis during her treatment. In this fashion, a CT scan was obtained through the chest area and an isocenter was placed near the chest wall within the breast.  The patient will be planned to receive a course of radiation initially to a dose of 42.56 Gy. This will consist of a whole breast radiotherapy technique. To accomplish this, 2 customized blocks have been designed which will correspond to medial and lateral whole breast tangent fields. This treatment will be accomplished at 2.66 Gy per fraction. A forward planning technique will also be evaluated to determine if this approach improves the plan. It is anticipated that the patient will then receive a 8 Gy boost to the seroma cavity which has been contoured. This will be accomplished at 2 Gy per fraction.   This initial treatment will consist of a 3-D conformal technique. The seroma has been contoured as the primary target structure. Additionally, dose volume histograms of both this target as well as the lungs and heart will also be evaluated. Such an approach is necessary to ensure that the target area is adequately covered while the nearby critical  normal structures are adequately spared.  Plan:  The final anticipated total dose therefore will correspond to 50.56 Gy.   Special treatment procedure was  performed today due to the extra time and effort required by myself to plan and prepare this patient for deep inspiration breath hold technique.  I have determined cardiac sparing to be of benefit to this patient to prevent long term cardiac damage due to radiation of the heart.  Bellows were placed on the patient's abdomen. To facilitate cardiac sparing, the patient was coached by the radiation therapists on breath hold techniques and breathing practice was performed. Practice waveforms were obtained. The patient was then scanned while maintaining breath hold in the treatment position.  This image was then transferred over to the imaging specialist. The imaging specialist then created a fusion of the free breathing and breath hold scans using the chest wall as the stable structure. I personally reviewed the fusion in axial, coronal and sagittal image planes.  Excellent cardiac sparing was obtained.  I felt the patient is an appropriate candidate for breath hold and the patient will be treated as such.  The image fusion was then reviewed with the patient to reinforce the necessity of reproducible breath hold.     _______________________________   Jodelle Gross, MD, PhD

## 2019-08-24 DIAGNOSIS — C50412 Malignant neoplasm of upper-outer quadrant of left female breast: Secondary | ICD-10-CM | POA: Diagnosis not present

## 2019-08-26 ENCOUNTER — Ambulatory Visit
Admission: RE | Admit: 2019-08-26 | Discharge: 2019-08-26 | Disposition: A | Discharge status: Home / Self Care | Payer: 59 | Source: Ambulatory Visit | Attending: Radiation Oncology | Admitting: Radiation Oncology

## 2019-08-26 ENCOUNTER — Other Ambulatory Visit: Payer: Self-pay

## 2019-08-26 DIAGNOSIS — C50412 Malignant neoplasm of upper-outer quadrant of left female breast: Secondary | ICD-10-CM | POA: Diagnosis not present

## 2019-08-27 ENCOUNTER — Other Ambulatory Visit: Payer: Self-pay

## 2019-08-27 ENCOUNTER — Ambulatory Visit
Admission: RE | Admit: 2019-08-27 | Discharge: 2019-08-27 | Disposition: A | Discharge status: Home / Self Care | Payer: 59 | Source: Ambulatory Visit | Attending: Radiation Oncology | Admitting: Radiation Oncology

## 2019-08-27 DIAGNOSIS — C50412 Malignant neoplasm of upper-outer quadrant of left female breast: Secondary | ICD-10-CM | POA: Diagnosis not present

## 2019-08-27 MED ORDER — RADIAPLEXRX EX GEL
Freq: Once | CUTANEOUS | Status: AC
  Administered 2019-08-27: 11:00:00 via TOPICAL

## 2019-08-27 MED ORDER — ALRA NON-METALLIC DEODORANT (RAD-ONC)
1.0000 "application " | Freq: Once | TOPICAL | Status: AC
  Administered 2019-08-27: 1 via TOPICAL

## 2019-08-27 NOTE — Progress Notes (Signed)
Pt here for patient teaching.  Pt given Radiation and You booklet, skin care instructions, Alra deodorant and Radiaplex gel.  Reviewed areas of pertinence such as fatigue, hair loss, skin changes, breast tenderness and breast swelling . Pt able to give teach back of to pat skin and use unscented/gentle soap,apply Radiaplex bid, avoid applying anything to skin within 4 hours of treatment, avoid wearing an under wire bra and to use an electric razor if they must shave. Pt verbalizes understanding of information given and will contact nursing with any questions or concerns.     Archer Moist M. Daesha Insco RN, BSN      

## 2019-08-30 ENCOUNTER — Other Ambulatory Visit: Payer: Self-pay

## 2019-08-30 ENCOUNTER — Ambulatory Visit
Admission: RE | Admit: 2019-08-30 | Discharge: 2019-08-30 | Disposition: A | Discharge status: Home / Self Care | Payer: 59 | Source: Ambulatory Visit | Attending: Radiation Oncology | Admitting: Radiation Oncology

## 2019-08-30 DIAGNOSIS — C50412 Malignant neoplasm of upper-outer quadrant of left female breast: Secondary | ICD-10-CM | POA: Diagnosis not present

## 2019-08-31 ENCOUNTER — Ambulatory Visit
Admission: RE | Admit: 2019-08-31 | Discharge: 2019-08-31 | Disposition: A | Discharge status: Home / Self Care | Payer: 59 | Source: Ambulatory Visit | Attending: Radiation Oncology | Admitting: Radiation Oncology

## 2019-08-31 ENCOUNTER — Other Ambulatory Visit: Payer: Self-pay

## 2019-08-31 ENCOUNTER — Telehealth: Payer: Self-pay | Admitting: Oncology

## 2019-08-31 DIAGNOSIS — C50412 Malignant neoplasm of upper-outer quadrant of left female breast: Secondary | ICD-10-CM | POA: Diagnosis not present

## 2019-08-31 NOTE — Telephone Encounter (Signed)
Returned patient's phone call regarding October appointments, transferred patient to speak with providers nurse for further instructions.

## 2019-09-01 ENCOUNTER — Ambulatory Visit
Admission: RE | Admit: 2019-09-01 | Discharge: 2019-09-01 | Disposition: A | Discharge status: Home / Self Care | Payer: 59 | Source: Ambulatory Visit | Attending: Radiation Oncology | Admitting: Radiation Oncology

## 2019-09-01 ENCOUNTER — Other Ambulatory Visit: Payer: Self-pay

## 2019-09-01 ENCOUNTER — Telehealth: Payer: Self-pay | Admitting: Oncology

## 2019-09-01 DIAGNOSIS — C50412 Malignant neoplasm of upper-outer quadrant of left female breast: Secondary | ICD-10-CM | POA: Diagnosis not present

## 2019-09-01 NOTE — Telephone Encounter (Signed)
R/s appt per 9/22 sch message - pt aware of appt date and time  . Date and time on 10/26 @ 830 - ok per RN Kathlee Nations

## 2019-09-02 ENCOUNTER — Other Ambulatory Visit: Payer: Self-pay

## 2019-09-02 ENCOUNTER — Ambulatory Visit
Admission: RE | Admit: 2019-09-02 | Discharge: 2019-09-02 | Disposition: A | Discharge status: Home / Self Care | Payer: 59 | Source: Ambulatory Visit | Attending: Radiation Oncology | Admitting: Radiation Oncology

## 2019-09-02 DIAGNOSIS — C50412 Malignant neoplasm of upper-outer quadrant of left female breast: Secondary | ICD-10-CM | POA: Diagnosis not present

## 2019-09-03 ENCOUNTER — Other Ambulatory Visit: Payer: Self-pay

## 2019-09-03 ENCOUNTER — Ambulatory Visit
Admission: RE | Admit: 2019-09-03 | Discharge: 2019-09-03 | Disposition: A | Discharge status: Home / Self Care | Payer: 59 | Source: Ambulatory Visit | Attending: Radiation Oncology | Admitting: Radiation Oncology

## 2019-09-03 DIAGNOSIS — C50412 Malignant neoplasm of upper-outer quadrant of left female breast: Secondary | ICD-10-CM | POA: Diagnosis not present

## 2019-09-06 ENCOUNTER — Other Ambulatory Visit: Payer: Self-pay

## 2019-09-06 ENCOUNTER — Ambulatory Visit
Admission: RE | Admit: 2019-09-06 | Discharge: 2019-09-06 | Disposition: A | Discharge status: Home / Self Care | Payer: 59 | Source: Ambulatory Visit | Attending: Radiation Oncology | Admitting: Radiation Oncology

## 2019-09-06 DIAGNOSIS — C50412 Malignant neoplasm of upper-outer quadrant of left female breast: Secondary | ICD-10-CM | POA: Diagnosis not present

## 2019-09-07 ENCOUNTER — Other Ambulatory Visit: Payer: Self-pay

## 2019-09-07 ENCOUNTER — Ambulatory Visit
Admission: RE | Admit: 2019-09-07 | Discharge: 2019-09-07 | Disposition: A | Discharge status: Home / Self Care | Payer: 59 | Source: Ambulatory Visit | Attending: Radiation Oncology | Admitting: Radiation Oncology

## 2019-09-07 DIAGNOSIS — C50412 Malignant neoplasm of upper-outer quadrant of left female breast: Secondary | ICD-10-CM | POA: Diagnosis not present

## 2019-09-08 ENCOUNTER — Ambulatory Visit
Admission: RE | Admit: 2019-09-08 | Discharge: 2019-09-08 | Disposition: A | Discharge status: Home / Self Care | Payer: 59 | Source: Ambulatory Visit | Attending: Radiation Oncology | Admitting: Radiation Oncology

## 2019-09-08 ENCOUNTER — Other Ambulatory Visit: Payer: Self-pay

## 2019-09-08 DIAGNOSIS — C50412 Malignant neoplasm of upper-outer quadrant of left female breast: Secondary | ICD-10-CM | POA: Diagnosis not present

## 2019-09-09 ENCOUNTER — Ambulatory Visit
Admission: RE | Admit: 2019-09-09 | Discharge: 2019-09-09 | Disposition: A | Discharge status: Home / Self Care | Payer: 59 | Source: Ambulatory Visit | Attending: Radiation Oncology | Admitting: Radiation Oncology

## 2019-09-09 ENCOUNTER — Other Ambulatory Visit: Payer: Self-pay

## 2019-09-09 DIAGNOSIS — Z170 Estrogen receptor positive status [ER+]: Secondary | ICD-10-CM | POA: Diagnosis present

## 2019-09-09 DIAGNOSIS — C50412 Malignant neoplasm of upper-outer quadrant of left female breast: Secondary | ICD-10-CM | POA: Diagnosis present

## 2019-09-10 ENCOUNTER — Ambulatory Visit
Admission: RE | Admit: 2019-09-10 | Discharge: 2019-09-10 | Disposition: A | Discharge status: Home / Self Care | Payer: 59 | Source: Ambulatory Visit | Attending: Radiation Oncology | Admitting: Radiation Oncology

## 2019-09-10 ENCOUNTER — Ambulatory Visit: Payer: 59 | Admitting: Radiation Oncology

## 2019-09-10 ENCOUNTER — Other Ambulatory Visit: Payer: Self-pay

## 2019-09-10 DIAGNOSIS — C50412 Malignant neoplasm of upper-outer quadrant of left female breast: Secondary | ICD-10-CM | POA: Diagnosis not present

## 2019-09-13 ENCOUNTER — Ambulatory Visit
Admission: RE | Admit: 2019-09-13 | Discharge: 2019-09-13 | Disposition: A | Discharge status: Home / Self Care | Payer: 59 | Source: Ambulatory Visit | Attending: Radiation Oncology | Admitting: Radiation Oncology

## 2019-09-13 ENCOUNTER — Other Ambulatory Visit: Payer: Self-pay

## 2019-09-13 DIAGNOSIS — C50412 Malignant neoplasm of upper-outer quadrant of left female breast: Secondary | ICD-10-CM | POA: Diagnosis not present

## 2019-09-14 ENCOUNTER — Ambulatory Visit
Admission: RE | Admit: 2019-09-14 | Discharge: 2019-09-14 | Disposition: A | Discharge status: Home / Self Care | Payer: 59 | Source: Ambulatory Visit | Attending: Radiation Oncology | Admitting: Radiation Oncology

## 2019-09-14 ENCOUNTER — Other Ambulatory Visit: Payer: Self-pay

## 2019-09-14 DIAGNOSIS — C50412 Malignant neoplasm of upper-outer quadrant of left female breast: Secondary | ICD-10-CM | POA: Diagnosis not present

## 2019-09-15 ENCOUNTER — Ambulatory Visit: Payer: 59 | Admitting: Oncology

## 2019-09-15 ENCOUNTER — Other Ambulatory Visit: Payer: Self-pay

## 2019-09-15 ENCOUNTER — Ambulatory Visit
Admission: RE | Admit: 2019-09-15 | Discharge: 2019-09-15 | Disposition: A | Discharge status: Home / Self Care | Payer: 59 | Source: Ambulatory Visit | Attending: Radiation Oncology | Admitting: Radiation Oncology

## 2019-09-15 DIAGNOSIS — C50412 Malignant neoplasm of upper-outer quadrant of left female breast: Secondary | ICD-10-CM | POA: Diagnosis not present

## 2019-09-16 ENCOUNTER — Ambulatory Visit
Admission: RE | Admit: 2019-09-16 | Discharge: 2019-09-16 | Disposition: A | Discharge status: Home / Self Care | Payer: 59 | Source: Ambulatory Visit | Attending: Radiation Oncology | Admitting: Radiation Oncology

## 2019-09-16 ENCOUNTER — Other Ambulatory Visit: Payer: Self-pay

## 2019-09-16 DIAGNOSIS — C50412 Malignant neoplasm of upper-outer quadrant of left female breast: Secondary | ICD-10-CM | POA: Diagnosis not present

## 2019-09-17 ENCOUNTER — Other Ambulatory Visit: Payer: Self-pay

## 2019-09-17 ENCOUNTER — Ambulatory Visit
Admission: RE | Admit: 2019-09-17 | Discharge: 2019-09-17 | Disposition: A | Discharge status: Home / Self Care | Payer: 59 | Source: Ambulatory Visit | Attending: Radiation Oncology | Admitting: Radiation Oncology

## 2019-09-17 DIAGNOSIS — Z170 Estrogen receptor positive status [ER+]: Secondary | ICD-10-CM | POA: Diagnosis present

## 2019-09-17 DIAGNOSIS — C50412 Malignant neoplasm of upper-outer quadrant of left female breast: Secondary | ICD-10-CM | POA: Diagnosis present

## 2019-09-20 ENCOUNTER — Ambulatory Visit
Admission: RE | Admit: 2019-09-20 | Discharge: 2019-09-20 | Disposition: A | Discharge status: Home / Self Care | Payer: 59 | Source: Ambulatory Visit | Attending: Radiation Oncology | Admitting: Radiation Oncology

## 2019-09-20 ENCOUNTER — Other Ambulatory Visit: Payer: Self-pay

## 2019-09-20 DIAGNOSIS — C50412 Malignant neoplasm of upper-outer quadrant of left female breast: Secondary | ICD-10-CM | POA: Diagnosis not present

## 2019-09-21 ENCOUNTER — Other Ambulatory Visit: Payer: Self-pay

## 2019-09-21 ENCOUNTER — Ambulatory Visit
Admission: RE | Admit: 2019-09-21 | Discharge: 2019-09-21 | Disposition: A | Discharge status: Home / Self Care | Payer: 59 | Source: Ambulatory Visit | Attending: Radiation Oncology | Admitting: Radiation Oncology

## 2019-09-21 DIAGNOSIS — C50412 Malignant neoplasm of upper-outer quadrant of left female breast: Secondary | ICD-10-CM | POA: Diagnosis not present

## 2019-09-22 ENCOUNTER — Encounter: Payer: Self-pay | Admitting: Radiation Oncology

## 2019-09-22 ENCOUNTER — Encounter: Payer: Self-pay | Admitting: *Deleted

## 2019-09-22 ENCOUNTER — Ambulatory Visit
Admission: RE | Admit: 2019-09-22 | Discharge: 2019-09-22 | Disposition: A | Discharge status: Home / Self Care | Payer: 59 | Source: Ambulatory Visit | Attending: Radiation Oncology | Admitting: Radiation Oncology

## 2019-09-22 ENCOUNTER — Other Ambulatory Visit: Payer: Self-pay

## 2019-09-22 DIAGNOSIS — C50412 Malignant neoplasm of upper-outer quadrant of left female breast: Secondary | ICD-10-CM | POA: Diagnosis not present

## 2019-10-03 NOTE — Progress Notes (Signed)
[facility]  [phone] Fax:([phone]) [phone]     ID: DEVANI ODONNEL DOB: Apr 14, 1958  MR#: [id number]  YME#:[id number]  Patient Care Team: Donald Prose, MD as PCP - General (Family Medicine) Izora Gala, MD as Consulting Physician (Otolaryngology) Magrinat, Virgie Dad, MD as Consulting Physician (Oncology) Kyung Rudd, MD as Consulting Physician (Radiation Oncology) Stark Klein, MD as Consulting Physician (General Surgery) Rockwell Germany, RN as Oncology Nurse Navigator Mauro Kaufmann, RN as Oncology Nurse Navigator Chauncey Cruel, MD OTHER MD:  CHIEF COMPLAINT: Ductal carcinoma in situ, left, estrogen receptor positive  CURRENT TREATMENT: tamoxifen at the 10 mg dose   INTERVAL HISTORY: Corky Sox returns today for follow up of her estrogen receptor positive noninvasive breast cancer.  Since her last visit, she underwent genetic testing on 06/22/2019. Results were negative.  She opted to proceed with left breast lumpectomy on 07/15/2019 under Dr. Barry Dienes. Pathology from the procedure ([id number]) showed no residual cancer.  She underwent radiation treatments from 08/26/2019 to 09/22/2019. She tolerated these relatively well.   REVIEW OF SYSTEMS: Angel Gaines did well with her surgery, with no significant pain, bleeding, fever, or other complications. She never used the oxycodone--never filled out the prescription. She is taking selenium. She exercises by walking and she walks 5 miles the last couple of days while at the beach with a friend. She is taking appropriate pandemic precautions and is working from home. A detailed review of systems today was otherwise stable   HISTORY OF CURRENT ILLNESS: From the original intake note:  "Angel Gaines" had routine screening mammography on 05/11/2018 showing a possible abnormality in the left breast. Diagnostic mammogram on 05/15/2018 showed calcifications in the left breast, and she was recommended to follow up in 6 months. These  calcifications remained stable Deceber 2019 by report. She underwent bilateral diagnostic mammography with tomography and left breast ultrasonography at [facility] on 05/13/2019 showing: breast density category C; 4 mm area of grouped calcifications in the left breast upper outer aspect posterior depth. Left axilla ultrasound was performed on 05/19/2019 and showed no evidence of malignancy.  Accordingly on 05/19/2019 she proceeded to biopsy of the left breast area in question. The pathology from this procedure ([id number]) showed: ductal carcinoma in situ with calcifications, grade 3; fibrocystic changes with calcifications. Prognostic indicators significant for: estrogen receptor, 100% positive with strong staining intensity and progesterone receptor, 60% positive with moderate to weak staining intensity.   The patient's subsequent history is as detailed below.   PAST MEDICAL HISTORY: Past Medical History:  Diagnosis Date  . Family history of breast cancer in female   . Family history of cervical cancer   . Family history of lung cancer   . Pressure in head     PAST SURGICAL HISTORY: Past Surgical History:  Procedure Laterality Date  . BREAST LUMPECTOMY WITH RADIOACTIVE SEED LOCALIZATION Left 07/15/2019   Procedure: LEFT BREAST LUMPECTOMY WITH RADIOACTIVE SEED LOCALIZATION;  Surgeon: Stark Klein, MD;  Location: [place];  Service: General;  Laterality: Left;    FAMILY HISTORY: Family History  Problem Relation Age of Onset  . Sarcoidosis Mother   . Lung cancer Father 21       Smoker  . Breast cancer Maternal Grandfather 45  . Cervical cancer Maternal Aunt 68  As of June 2020 patient's father is living at 50 years old. He was adopted, so she doesn't know anything about his family. He has a history of lung cancer and was a heavy smoker.  Patient's  mother is also living at age 8.  The patient's maternal grandfather was diagnosed with breast cancer.  The patient has 3 siblings, 2  sisters and 1 brother. Unfortunately, her brother died in a car accident when he was 11.   GYNECOLOGIC HISTORY:  No LMP recorded. Patient is postmenopausal. Menarche: 63 years old [place] P 0 LMP around age 72 Contraceptive: taken for 2 years with no complications HRT: no  Hysterectomy? no BSO? no   SOCIAL HISTORY: (updated October 2020) Hatsuko works in Engineer, technical sales for [employer]. She is single, never married. Her adopted daughter, [name]. age 25, is a Ship broker at [employer] with a major in teaching.     ADVANCED DIRECTIVES: Her sister, [name], is her HCPOA.  [name] can be reached at [phone]   HEALTH MAINTENANCE: Social History   Tobacco Use  . Smoking status: Never Smoker  . Smokeless tobacco: Never Used  Substance Use Topics  . Alcohol use: Yes    Alcohol/week: 2.0 standard drinks    Types: 2 Glasses of wine per week  . Drug use: No     Colonoscopy: due 2021  PAP: 04/30/2019, negative  Bone density: 2007, -1.1 (osteopenia)   No Known Allergies  Current Outpatient Medications  Medication Sig Dispense Refill  . aspirin EC 81 MG tablet Take 81 mg by mouth daily.    . Cholecalciferol (VITAMIN D) 2000 units CAPS Take 2,000 Units by mouth daily.    . Multiple Vitamin (MULTIVITAMIN) tablet Take 1 tablet by mouth daily.    . Omega-3 Fatty Acids (FISH OIL PO) Take by mouth.     No current facility-administered medications for this visit.     OBJECTIVE: Middle-aged white woman who appears well  Vitals:   10/04/19 0905  BP: (!) 151/59  Pulse: 98  Resp: 18  Temp: 97.8 F (36.6 C)  SpO2: 100%     Body mass index is 29.03 kg/m.   Wt Readings from Last 3 Encounters:  10/04/19 158 lb 11.2 oz (72 kg)  07/15/19 152 lb 1.9 oz (69 kg)  06/08/19 151 lb 11.2 oz (68.8 kg)      ECOG FS:0 - Asymptomatic  Sclerae unicteric, EOMs intact Wearing a mask No cervical or supraclavicular adenopathy Lungs no rales or rhonchi Heart regular rate and rhythm Abd soft, nontender,  positive bowel sounds MSK no focal spinal tenderness, no upper extremity lymphedema Neuro: nonfocal, well oriented, appropriate affect Breasts: The right breast is benign. The left breast is status post lumpectomy and radiation. The cosmetic result is excellent. There is still some erythema, and mild skin coarsening around the areola. There is no evidence of disease recurrence. Both axillae are benign.   LAB RESULTS:  CMP  No results found for: NA, K, CL, CO2, GLUCOSE, BUN, CREATININE, CALCIUM, PROT, ALBUMIN, AST, ALT, ALKPHOS, BILITOT, GFRNONAA, GFRAA  No results found for: TOTALPROTELP, ALBUMINELP, A1GS, A2GS, BETS, BETA2SER, GAMS, MSPIKE, SPEI  No results found for: KPAFRELGTCHN, LAMBDASER, KAPLAMBRATIO  No results found for: WBC, NEUTROABS, HGB, HCT, MCV, PLT  _0 @  No results found for: LABCA2  No components found for: [id number]  No results for input(s): INR in the last 168 hours.  No results found for: LABCA2  No results found for: HAF790  No results found for: XYB338  No results found for: VAN191  No results found for: CA2729  No components found for: HGQUANT  No results found for: CEA1 / No results found for: CEA1   No results found for: AFPTUMOR  No  results found for: CHROMOGRNA  No results found for: PSA1  No visits with results within 3 Day(s) from this visit.  Latest known visit with results is:  Hospital Outpatient Visit on 07/12/2019  Component Date Value Ref Range Status  . SARS Coronavirus 2 07/12/2019 NEGATIVE  NEGATIVE Final   Comment: (NOTE) SARS-CoV-2 target nucleic acids are NOT DETECTED. The SARS-CoV-2 RNA is generally detectable in upper and lower respiratory specimens during the acute phase of infection. Negative results do not preclude SARS-CoV-2 infection, do not rule out co-infections with other pathogens, and should not be used as the sole basis for treatment or other patient management decisions. Negative results must be  combined with clinical observations, patient history, and epidemiological information. The expected result is Negative. Fact Sheet for Patients: SugarRoll.be Fact Sheet for Healthcare Providers: https://www.woods-mathews.com/ This test is not yet approved or cleared by the [place] FDA and  has been authorized for detection and/or diagnosis of SARS-CoV-2 by FDA under an Emergency Use Authorization (EUA). This EUA will remain  in effect (meaning this test can be used) for the duration of the COVID-19 declaration under Section 56                          4(b)(1) of the Act, 21 U.S.C. section 360bbb-3(b)(1), unless the authorization is terminated or revoked sooner. Performed at [facility], [place] [address], [place], [place] [address]     (this displays the last labs from the last 3 days)  No results found for: TOTALPROTELP, ALBUMINELP, A1GS, A2GS, BETS, BETA2SER, GAMS, MSPIKE, SPEI (this displays SPEP labs)  No results found for: KPAFRELGTCHN, LAMBDASER, KAPLAMBRATIO (kappa/lambda light chains)  No results found for: HGBA, HGBA2QUANT, HGBFQUANT, HGBSQUAN (Hemoglobinopathy evaluation)   No results found for: LDH  No results found for: IRON, TIBC, IRONPCTSAT (Iron and TIBC)  No results found for: FERRITIN  Urinalysis No results found for: COLORURINE, APPEARANCEUR, LABSPEC, PHURINE, GLUCOSEU, HGBUR, BILIRUBINUR, KETONESUR, PROTEINUR, UROBILINOGEN, NITRITE, LEUKOCYTESUR   STUDIES: No results found.  ELIGIBLE FOR AVAILABLE RESEARCH PROTOCOL: no  ASSESSMENT: 63 y.o. [place] woman status post left breast biopsy 05/19/2019 for a 0.4 cm area of ductal carcinoma in situ, grade 3, estrogen and progesterone receptor positive  (1) s/p left lumpectomy 07/15/2019 showing no residual cancer  (2) adjuvant radiation 08/26/2019 - 09/22/2019  (a) Left breast / 42.56 Gy in 16 fractions  (b) Left breast boost / 8 Gy in 4 fractions  (3)  tamoxifen started neoadjuvantly, discontinued around the time of surgery, resumed 10/04/2019  (a) patient is taking the 10 mg daily dose in setting of DCIS  4) genetics testing 06/28/2019 through the Multi-Cancer Panel offered by [facility] found no deleterious mutations inAIP, ALK, APC, ATM, AXIN2,BAP1,  BARD1, BLM, BMPR1A, BRCA1, BRCA2, BRIP1, CASR, CDC73, CDH1, CDK4, CDKN1B, CDKN1C, CDKN2A (p14ARF), CDKN2A (p16INK4a), CEBPA, CHEK2, CTNNA1, DICER1, DIS3L2, EGFR (c.2369C>T, p.Thr790Met variant only), EPCAM (Deletion/duplication testing only), FH, FLCN, GATA2, GPC3, GREM1 (Promoter region deletion/duplication testing only), HOXB13 (c.251G>A, p.Gly84Glu), HRAS, KIT, MAX, MEN1, MET, MITF (c.952G>A, p.Glu318Lys variant only), MLH1, MSH2, MSH3, MSH6, MUTYH, NBN, NF1, NF2, NTHL1, PALB2, PDGFRA, PHOX2B, PMS2, POLD1, POLE, POT1, PRKAR1A, PTCH1, PTEN, RAD50, RAD51C, RAD51D, RB1, RECQL4, RET, RNF43, RUNX1, SDHAF2, SDHA (sequence changes only), SDHB, SDHC, SDHD, SMAD4, SMARCA4, SMARCB1, SMARCE1, STK11, SUFU, TERC, TERT, TMEM127, TP53, TSC1, TSC2, VHL, WRN and WT1.    PLAN:  Corky Sox has completed her local treatment for her very early stage ductal carcinoma in situ.  The chance of this tumor coming back is very low and she is really pretty much done with that from that point of view.  She does have least a 1 %/year chance of developing another breast cancer in either breast given the history. Tamoxifen would cut that risk in half. We discussed that, the possible toxicities side effects and complications of tamoxifen, and also the fact that we have data for lower dose tamoxifen in DCIS.  After much discussion what she would like to do is to go for the 10 mg tamoxifen dose and that is what I wrote for her. If she has any unusual side effects she will let me know. She'll have mammography late January or early February 2021 and she will return to see me in November 2021.  She knows to call for any other issue that may  develop before the next visit.  Chauncey Cruel, MD   10/04/2019 9:25 AM Medical Oncology and Hematology [facility] [address] [place], [place] [address] Tel. [phone]    Fax. [phone]   This document serves as a record of services personally performed by Lurline Del, MD. It was created on his behalf by Wilburn Mylar, a trained medical scribe. The creation of this record is based on the scribe's personal observations and the provider's statements to them.   I, Lurline Del MD, have reviewed the above documentation for accuracy and completeness, and I agree with the above.

## 2019-10-04 ENCOUNTER — Telehealth: Payer: Self-pay | Admitting: Oncology

## 2019-10-04 ENCOUNTER — Other Ambulatory Visit: Payer: Self-pay

## 2019-10-04 ENCOUNTER — Inpatient Hospital Stay: Payer: 59 | Attending: Oncology | Admitting: Oncology

## 2019-10-04 VITALS — BP 151/59 | HR 98 | Temp 97.8°F | Resp 18 | Ht 62.0 in | Wt 158.7 lb

## 2019-10-04 DIAGNOSIS — Z8049 Family history of malignant neoplasm of other genital organs: Secondary | ICD-10-CM | POA: Diagnosis not present

## 2019-10-04 DIAGNOSIS — M8580 Other specified disorders of bone density and structure, unspecified site: Secondary | ICD-10-CM | POA: Diagnosis not present

## 2019-10-04 DIAGNOSIS — D0512 Intraductal carcinoma in situ of left breast: Secondary | ICD-10-CM | POA: Insufficient documentation

## 2019-10-04 DIAGNOSIS — Z801 Family history of malignant neoplasm of trachea, bronchus and lung: Secondary | ICD-10-CM | POA: Insufficient documentation

## 2019-10-04 DIAGNOSIS — C50412 Malignant neoplasm of upper-outer quadrant of left female breast: Secondary | ICD-10-CM | POA: Diagnosis not present

## 2019-10-04 DIAGNOSIS — Z803 Family history of malignant neoplasm of breast: Secondary | ICD-10-CM | POA: Diagnosis not present

## 2019-10-04 DIAGNOSIS — Z170 Estrogen receptor positive status [ER+]: Secondary | ICD-10-CM | POA: Diagnosis not present

## 2019-10-04 DIAGNOSIS — Z79899 Other long term (current) drug therapy: Secondary | ICD-10-CM | POA: Insufficient documentation

## 2019-10-04 DIAGNOSIS — Z923 Personal history of irradiation: Secondary | ICD-10-CM | POA: Insufficient documentation

## 2019-10-04 DIAGNOSIS — Z7982 Long term (current) use of aspirin: Secondary | ICD-10-CM | POA: Diagnosis not present

## 2019-10-04 MED ORDER — TAMOXIFEN CITRATE 10 MG PO TABS: 10.0000 mg | ORAL_TABLET | Freq: Every day | ORAL | 4 refills | Status: DC

## 2019-10-04 NOTE — Telephone Encounter (Signed)
I talk with patient regarding schedule  

## 2019-10-18 ENCOUNTER — Other Ambulatory Visit: Payer: Self-pay

## 2019-10-18 DIAGNOSIS — Z20822 Contact with and (suspected) exposure to covid-19: Secondary | ICD-10-CM

## 2019-10-20 LAB — NOVEL CORONAVIRUS, NAA: SARS-CoV-2, NAA: NOT DETECTED

## 2019-10-21 ENCOUNTER — Telehealth: Payer: Self-pay | Admitting: Radiation Oncology

## 2019-10-21 NOTE — Telephone Encounter (Signed)
  Radiation Oncology         [phone] ________________________________  Name: KEYARI LOGIUDICE MRN: [id number]  Date of Service: 10/21/2019  DOB: [birth year]  Post Treatment Telephone Note  Diagnosis:  High Grade ER/PR positive DCIS of the left breast  Interval Since Last Radiation:  4 weeks   08/26/2019-09/22/2019: The left breast was treated to 42.56 Gy in 16 fractions and 8 Gy boost to the lumpectomy site in 4 fractions.   Narrative:  The patient was contacted today for routine follow-up. During treatment she did very well with radiotherapy and did not have significant desquamation.   Impression/Plan: 1. High Grade ER/PR positive DCIS of the left breast. The patient has been doing well since completion of radiotherapy. I had to leave a message but asked her to call back so we could review skin care considerations and find out how she's doing since completing treatment.    Carola Rhine, PAC

## 2019-10-28 NOTE — Progress Notes (Signed)
  Radiation Oncology         [phone] ________________________________  Name: Angel Gaines MRN: [id number]  Date: 09/22/2019  DOB: 1956/09/01  End of Treatment Note  Diagnosis:   left-sided breast cancer     Indication for treatment:  Curative       Radiation treatment dates:   08/26/19 - 09/22/19  Site/dose:   The patient initially received a dose of 42.56 Gy in 16 fractions to the breast using whole-breast tangent fields. This was delivered using a 3-D conformal technique. The patient then received a boost to the seroma. This delivered an additional 8 Gy in 82fractions using a 3 field photon technique due to the depth of the seroma. The total dose was 50.56 Gy.  Narrative: The patient tolerated radiation treatment relatively well.   The patient had some expected skin irritation as she progressed during treatment. Moist desquamation was not present at the end of treatment.  Plan: The patient has completed radiation treatment. The patient will return to radiation oncology clinic for routine followup in one month. I advised the patient to call or return sooner if they have any questions or concerns related to their recovery or treatment. ________________________________  Jodelle Gross, M.D., Ph.D.

## 2019-11-09 ENCOUNTER — Other Ambulatory Visit: Payer: Self-pay

## 2019-11-09 DIAGNOSIS — Z20822 Contact with and (suspected) exposure to covid-19: Secondary | ICD-10-CM

## 2019-11-11 LAB — NOVEL CORONAVIRUS, NAA: SARS-CoV-2, NAA: NOT DETECTED

## 2019-12-09 ENCOUNTER — Encounter: Payer: Self-pay | Admitting: *Deleted

## 2019-12-22 ENCOUNTER — Other Ambulatory Visit: Payer: Self-pay | Admitting: *Deleted

## 2019-12-22 MED ORDER — TAMOXIFEN CITRATE 10 MG PO TABS: 10.0000 mg | ORAL_TABLET | Freq: Every day | ORAL | 4 refills | Status: DC

## 2020-01-26 ENCOUNTER — Ambulatory Visit: Payer: 59 | Attending: Internal Medicine

## 2020-01-26 DIAGNOSIS — Z20822 Contact with and (suspected) exposure to covid-19: Secondary | ICD-10-CM

## 2020-01-27 LAB — NOVEL CORONAVIRUS, NAA: SARS-CoV-2, NAA: NOT DETECTED

## 2020-05-30 ENCOUNTER — Telehealth: Payer: Self-pay | Admitting: Oncology

## 2020-05-30 NOTE — Telephone Encounter (Signed)
R/s appt from 11/1 to 11/3 per pt request.

## 2020-06-13 ENCOUNTER — Ambulatory Visit (INDEPENDENT_AMBULATORY_CARE_PROVIDER_SITE_OTHER): Payer: 59 | Admitting: Otolaryngology

## 2020-06-20 ENCOUNTER — Encounter (INDEPENDENT_AMBULATORY_CARE_PROVIDER_SITE_OTHER): Payer: Self-pay | Admitting: Otolaryngology

## 2020-06-20 ENCOUNTER — Other Ambulatory Visit: Payer: Self-pay

## 2020-06-20 ENCOUNTER — Ambulatory Visit (INDEPENDENT_AMBULATORY_CARE_PROVIDER_SITE_OTHER): Payer: 59 | Admitting: Otolaryngology

## 2020-06-20 VITALS — Temp 97.7°F

## 2020-06-20 DIAGNOSIS — R040 Epistaxis: Secondary | ICD-10-CM

## 2020-06-20 NOTE — Progress Notes (Signed)
HPI: Angel Gaines is a 64 y.o. female who presents is referred by Dr. Nancy Fetter for evaluation of recurrent epistaxis.  On discussion with the patient she states that she has nosebleeds once or twice about every month over the past year.  Her last nosebleed was the second week of June.  She is not sure what triggers the nosebleeds.  She just feels something running from her nose and it is bleeding and is always in the right side.  She is able to stop the nosebleed within a couple minutes.  She presents here to have this further evaluated.  She generally does not have bleeding from the left side..  Past Medical History:  Diagnosis Date   Family history of breast cancer in female    Family history of cervical cancer    Family history of lung cancer    Pressure in head    Past Surgical History:  Procedure Laterality Date   BREAST LUMPECTOMY WITH RADIOACTIVE SEED LOCALIZATION Left 07/15/2019   Procedure: LEFT BREAST LUMPECTOMY WITH RADIOACTIVE SEED LOCALIZATION;  Surgeon: Stark Klein, MD;  Location: [place];  Service: General;  Laterality: Left;   Social History   Socioeconomic History   Marital status: Single    Spouse name: Not on file   Number of children: 1   Years of education: Masters   Highest education level: Not on file  Occupational History   Occupation: Biomedical engineer  Tobacco Use   Smoking status: Never Smoker   Smokeless tobacco: Never Used  Scientific laboratory technician Use: Never used  Substance and Sexual Activity   Alcohol use: Yes    Alcohol/week: 2.0 standard drinks    Types: 2 Glasses of wine per week   Drug use: No   Sexual activity: Never  Other Topics Concern   Not on file  Social History Narrative   Lives at home with one adopted daughter.   1-2 cups caffeine daily.   Right-handed.   Social Determinants of Health   Financial Resource Strain:    Difficulty of Paying Living Expenses:   Food Insecurity:    Worried About Ship broker in the Last Year:    Arboriculturist in the Last Year:   Transportation Needs:    Film/video editor (Medical):    Lack of Transportation (Non-Medical):   Physical Activity:    Days of Exercise per Week:    Minutes of Exercise per Session:   Stress:    Feeling of Stress :   Social Connections:    Frequency of Communication with Friends and Family:    Frequency of Social Gatherings with Friends and Family:    Attends Religious Services:    Active Member of Clubs or Organizations:    Attends Music therapist:    Marital Status:    Family History  Problem Relation Age of Onset   Sarcoidosis Mother    Lung cancer Father 67       Smoker   Breast cancer Maternal Grandfather 77   Cervical cancer Maternal Aunt 68   No Known Allergies Prior to Admission medications   Medication Sig Start Date End Date Taking? Authorizing Provider  aspirin EC 81 MG tablet Take 81 mg by mouth daily.    [provider]  Cholecalciferol (VITAMIN D) 2000 units CAPS Take 2,000 Units by mouth daily.    [provider]  Multiple Vitamin (MULTIVITAMIN) tablet Take 1 tablet by mouth daily.  [provider]  Omega-3 Fatty Acids (FISH OIL PO) Take by mouth.    [provider]  tamoxifen (NOLVADEX) 10 MG tablet Take 1 tablet (10 mg total) by mouth daily. 12/22/19   Magrinat, Virgie Dad, MD     Positive ROS: Otherwise negative  All other systems have been reviewed and were otherwise negative with the exception of those mentioned in the HPI and as above.  Physical Exam: Constitutional: Alert, well-appearing, no acute distress Ears: External ears without lesions or tenderness. Ear canals are clear bilaterally with intact, clear TMs.  Nasal: External nose without lesions. Septum with minimal deformity.  No intranasal masses or lesions noted.  Both middle meatus regions were clear..  She has several small prominent vessels along  Kiesselbach's plexus that I suspect are the etiology of her nosebleeds.  There are no fresh scabs or obvious single bleeding site on the right side.  There is no overly prominent vessel. Oral: Lips and gums without lesions. Tongue and palate mucosa without lesions. Posterior oropharynx clear. Neck: No palpable adenopathy or masses Respiratory: Breathing comfortably  Skin: No facial/neck lesions or rash noted.  Procedures  Assessment: History of recurrent right-sided epistaxis  Plan: I discussed with Angel Gaines that she has several small vessels that could have resulted in nosebleeds.  No intranasal abnormalities noted otherwise.  Discussed possible cauterization when she is having frequent nosebleeds or a bad nosebleed that is difficult to stop.  If she can show up in the office within 3 to 4 days I can typically identify the site of origin of the epistaxis and cauterize the vessel if needed. She will follow-up as needed any further epistaxis.  No cauterization was performed today.   Radene Journey, MD   CC:

## 2020-07-17 IMAGING — CR DG FEMUR 2+V*L*
4 series · 4 of 4 positions shown · non-contrast
Comparison: None.

CLINICAL DATA: The patient reports a fall 1 year ago with
persistent distal femoral pain similar to that of a bruise. No more
recent trauma.

EXAM:
LEFT FEMUR 2 VIEWS

[w hip ap left (1 of 2)]
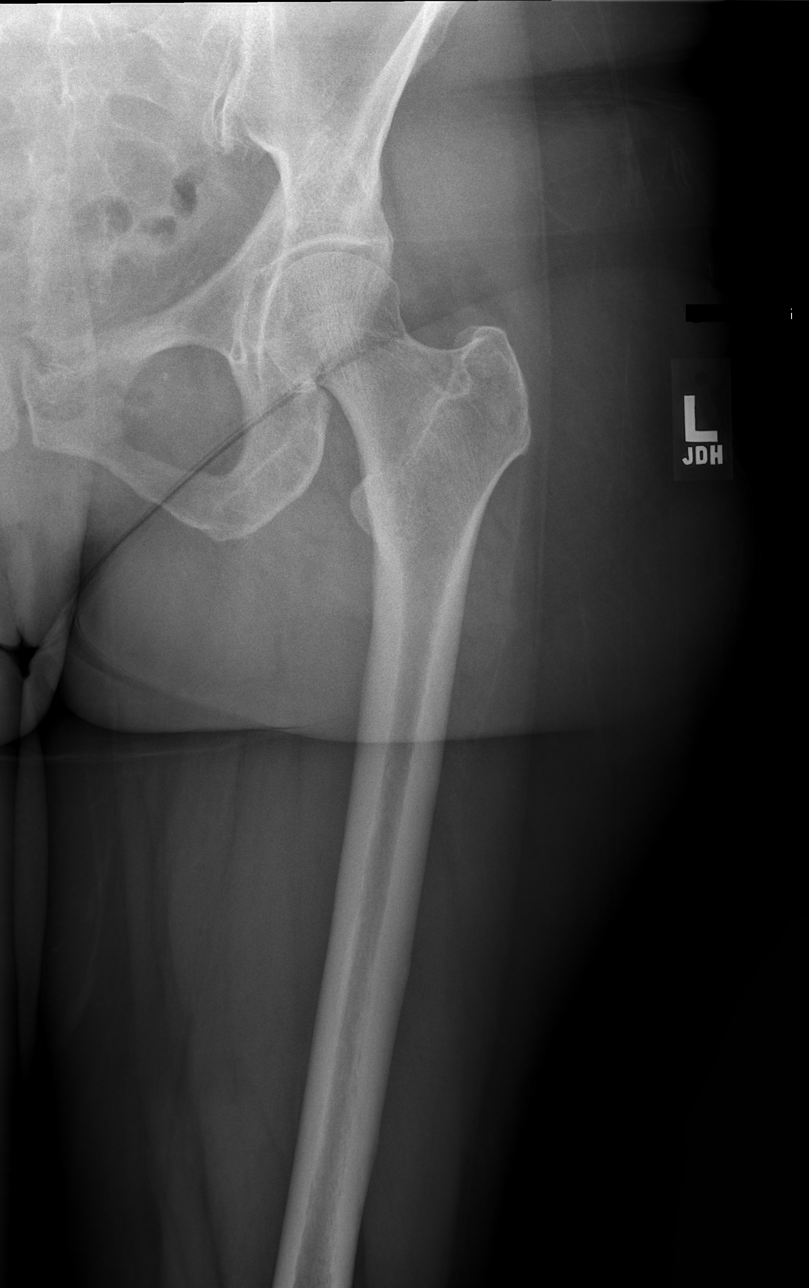

[w hip ap left (2 of 2)]
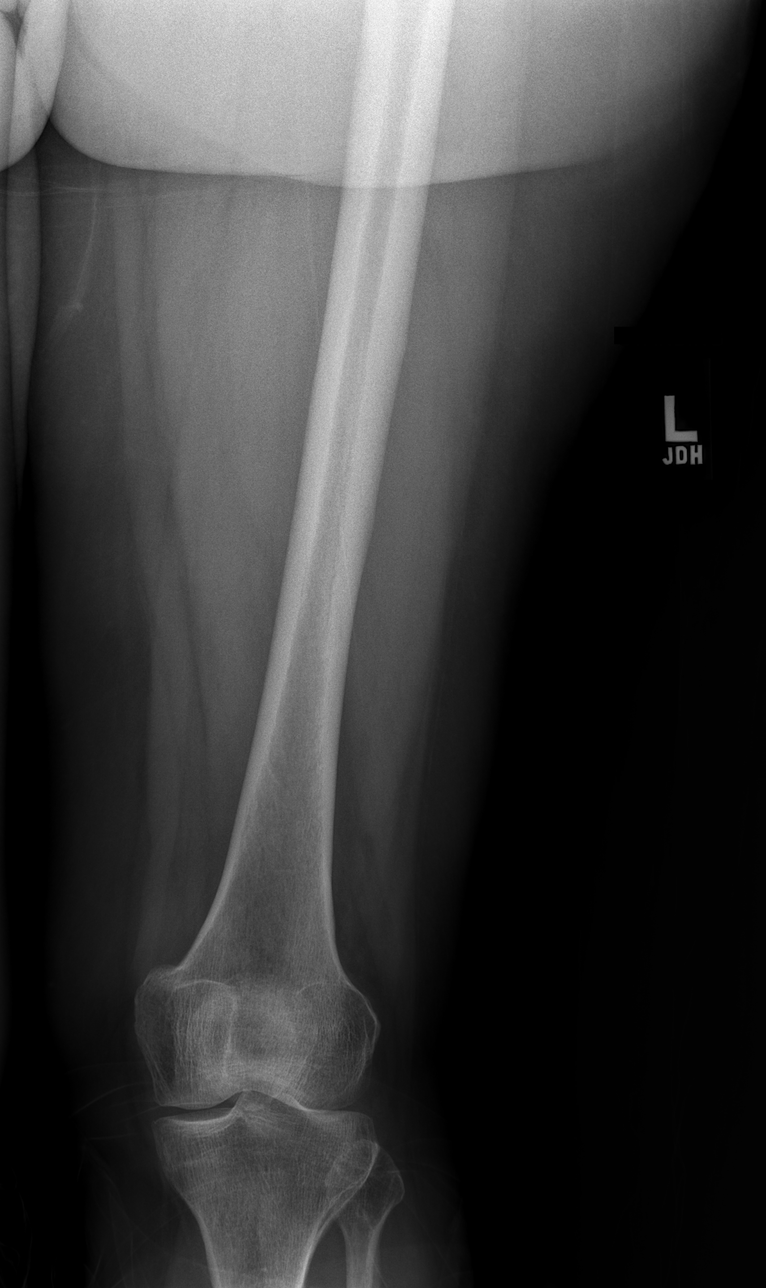

[w hip frog left]
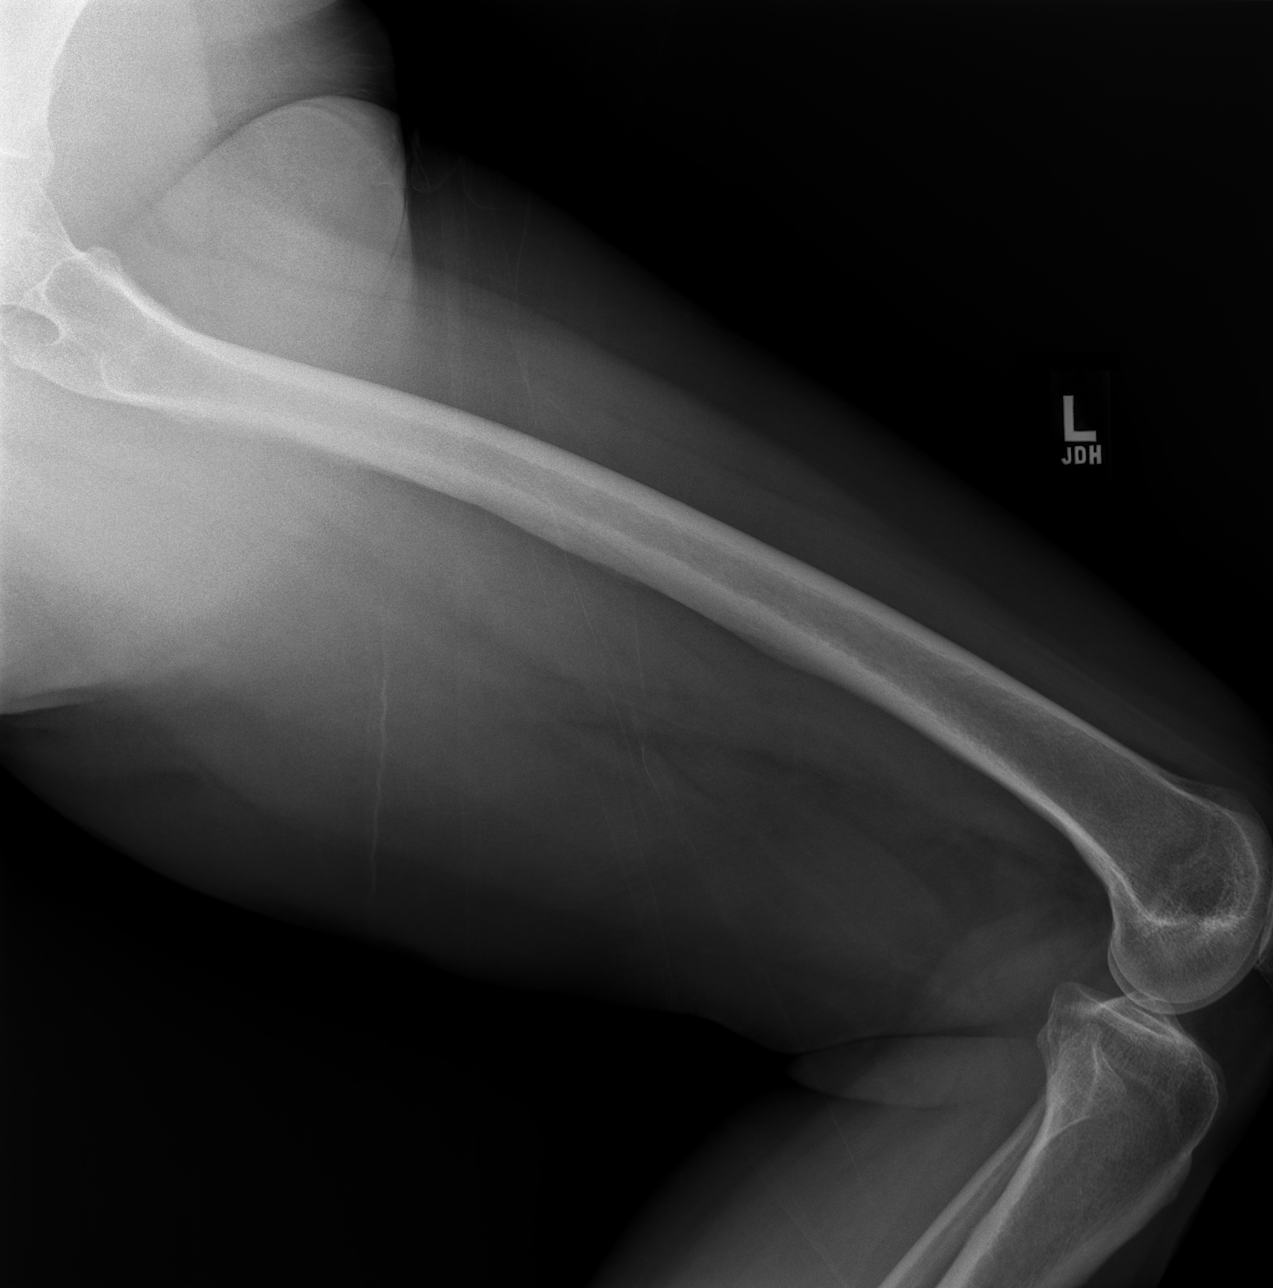

[w femur distal lat left]
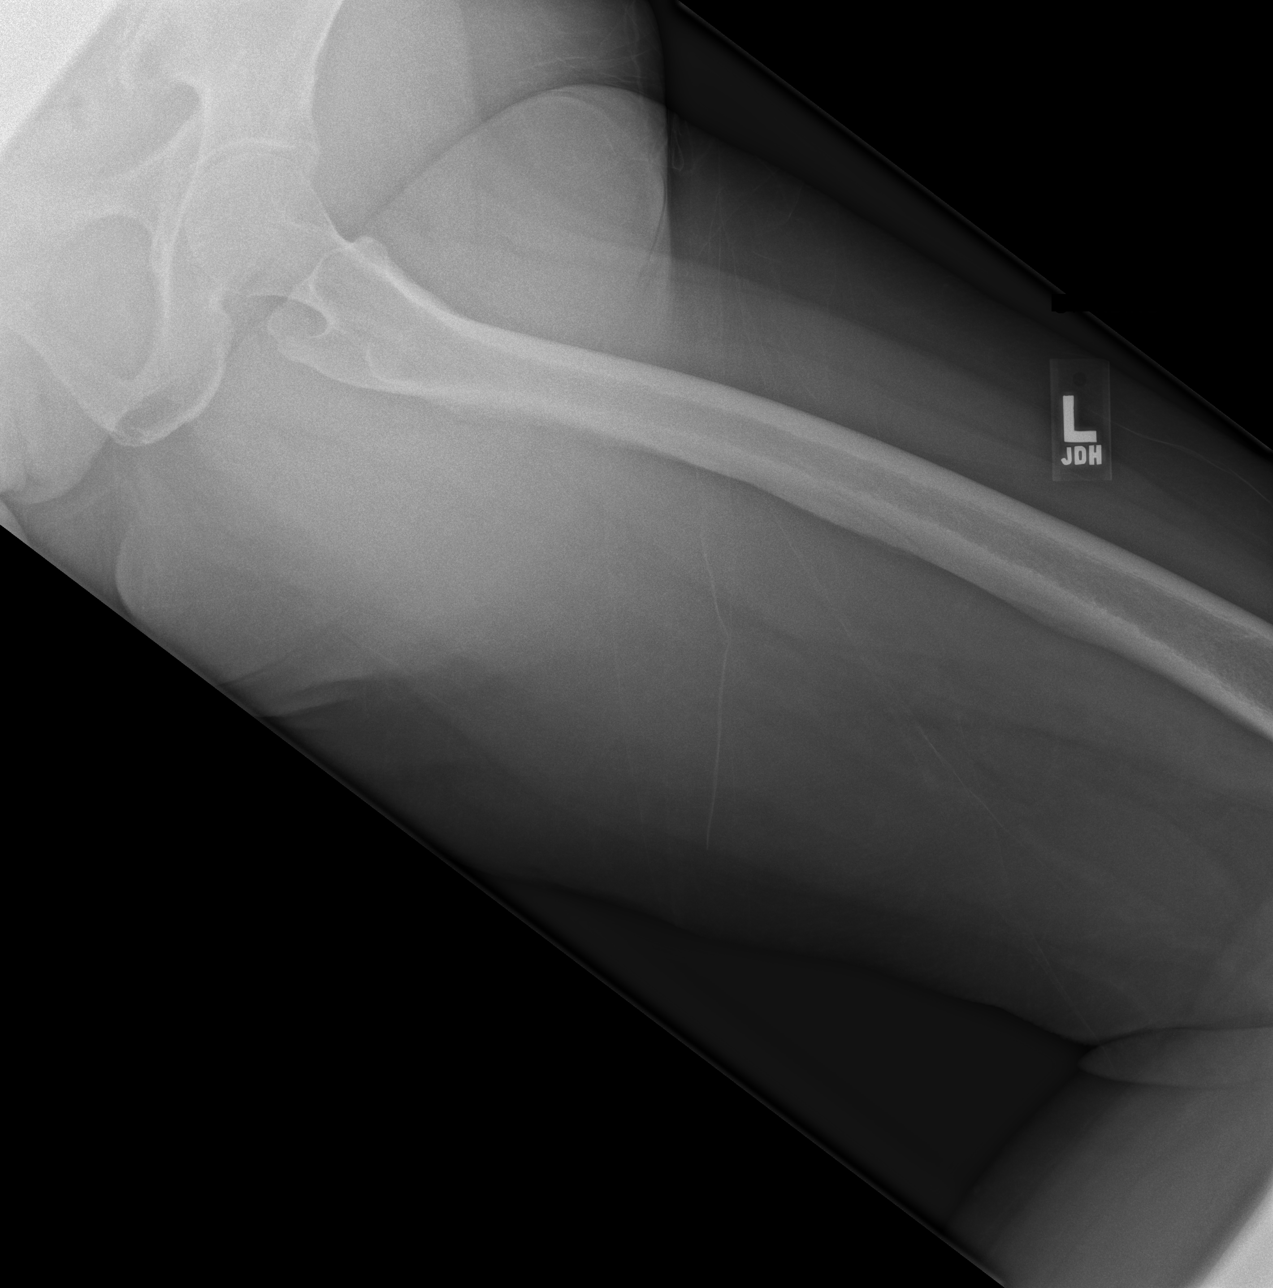

[4 of 4 positions shown; findings below may reference images not displayed]

FINDINGS: The bones are subjectively adequately mineralized. No acute or old
fracture is observed. There is no lytic or blastic lesion or
periosteal reaction. The observed portions of the left hip and left
knee exhibit no bony abnormalities. There are no abnormal soft
tissue calcifications.
IMPRESSION: There is no acute or significant chronic bony abnormality of the
left femur.

## 2020-10-09 ENCOUNTER — Ambulatory Visit: Payer: 59 | Admitting: Oncology

## 2020-10-11 ENCOUNTER — Other Ambulatory Visit: Payer: Self-pay

## 2020-10-11 ENCOUNTER — Inpatient Hospital Stay: Payer: 59 | Attending: Oncology | Admitting: Oncology

## 2020-10-11 VITALS — BP 138/79 | HR 71 | Temp 99.0°F | Resp 17 | Ht 62.0 in | Wt 156.0 lb

## 2020-10-11 DIAGNOSIS — Z833 Family history of diabetes mellitus: Secondary | ICD-10-CM | POA: Insufficient documentation

## 2020-10-11 DIAGNOSIS — Z8049 Family history of malignant neoplasm of other genital organs: Secondary | ICD-10-CM | POA: Diagnosis not present

## 2020-10-11 DIAGNOSIS — Z803 Family history of malignant neoplasm of breast: Secondary | ICD-10-CM | POA: Insufficient documentation

## 2020-10-11 DIAGNOSIS — Z170 Estrogen receptor positive status [ER+]: Secondary | ICD-10-CM | POA: Diagnosis not present

## 2020-10-11 DIAGNOSIS — Z79899 Other long term (current) drug therapy: Secondary | ICD-10-CM | POA: Insufficient documentation

## 2020-10-11 DIAGNOSIS — Z801 Family history of malignant neoplasm of trachea, bronchus and lung: Secondary | ICD-10-CM | POA: Insufficient documentation

## 2020-10-11 DIAGNOSIS — C50412 Malignant neoplasm of upper-outer quadrant of left female breast: Secondary | ICD-10-CM | POA: Diagnosis present

## 2020-10-11 DIAGNOSIS — Z7982 Long term (current) use of aspirin: Secondary | ICD-10-CM | POA: Insufficient documentation

## 2020-10-11 DIAGNOSIS — Z79810 Long term (current) use of selective estrogen receptor modulators (SERMs): Secondary | ICD-10-CM | POA: Insufficient documentation

## 2020-10-11 NOTE — Progress Notes (Signed)
[place]  [phone] [phone]) [phone]     ID: Angel Gaines DOB: 11/02/1956  MR#: [id number]  YIF#:[id number]  Patient Care Team: Donald Prose, MD as PCP - General (Family Medicine) Izora Gala, MD as Consulting Physician (Otolaryngology) Mareena Cavan, Virgie Dad, MD as Consulting Physician (Oncology) Kyung Rudd, MD as Consulting Physician (Radiation Oncology) Stark Klein, MD as Consulting Physician (General Surgery) Rockwell Germany, RN as Oncology Nurse Navigator Mauro Kaufmann, RN as Oncology Nurse Navigator Chauncey Cruel, MD OTHER MD:  CHIEF COMPLAINT: Ductal carcinoma in situ, left, estrogen receptor positive  CURRENT TREATMENT: tamoxifen at the 10 mg dose   INTERVAL HISTORY: Angel Gaines returns today for follow up of her estrogen receptor positive noninvasive breast cancer.  She was restarted on tamoxifen at her last visit on 10/04/2019.  She tolerates this well, with some hot flashes which are milder than they were when she originally went through menopause.  She underwent mammogram at [facility] 02/15/2020 showing a breast density category C.  There was no evidence of malignancy.   REVIEW OF SYSTEMS: Angel Gaines walks about 1 mile to 2 miles a couple of times a week.  She also walks her dog.  She does water aerobics twice a week.  She retired this year and has been doing some traveling, spending 3 weeks in [place] recently for example.  A detailed review of systems today was otherwise benign   COVID 19 VACCINATION STATUS: Status post [place] x2, most recently April 2021   HISTORY OF CURRENT ILLNESS: From the original intake note:  "Angel Gaines" had routine screening mammography on 05/11/2018 showing a possible abnormality in the left breast. Diagnostic mammogram on 05/15/2018 showed calcifications in the left breast, and she was recommended to follow up in 6 months. These calcifications remained stable Deceber 2019 by report. She underwent bilateral diagnostic  mammography with tomography and left breast ultrasonography at [facility] on 05/13/2019 showing: breast density category C; 4 mm area of grouped calcifications in the left breast upper outer aspect posterior depth. Left axilla ultrasound was performed on 05/19/2019 and showed no evidence of malignancy.  Accordingly on 05/19/2019 she proceeded to biopsy of the left breast area in question. The pathology from this procedure ([id number]) showed: ductal carcinoma in situ with calcifications, grade 3; fibrocystic changes with calcifications. Prognostic indicators significant for: estrogen receptor, 100% positive with strong staining intensity and progesterone receptor, 60% positive with moderate to weak staining intensity.   The patient's subsequent history is as detailed below.   PAST MEDICAL HISTORY: Past Medical History:  Diagnosis Date  . Family history of breast cancer in female   . Family history of cervical cancer   . Family history of lung cancer   . Pressure in head     PAST SURGICAL HISTORY: Past Surgical History:  Procedure Laterality Date  . BREAST LUMPECTOMY WITH RADIOACTIVE SEED LOCALIZATION Left 07/15/2019   Procedure: LEFT BREAST LUMPECTOMY WITH RADIOACTIVE SEED LOCALIZATION;  Surgeon: Stark Klein, MD;  Location: [place];  Service: General;  Laterality: Left;    FAMILY HISTORY: Family History  Problem Relation Age of Onset  . Sarcoidosis Mother   . Lung cancer Father 33       Smoker  . Breast cancer Maternal Grandfather 7  . Cervical cancer Maternal Aunt 68  As of June 2020 patient's father is living at 36 years old. He was adopted, so she doesn't know anything about his family. He has a history of lung cancer and was a  heavy smoker. Patient's mother is also living at age 39.  The patient's maternal grandfather was diagnosed with breast cancer.  The patient has 3 siblings, 2 sisters and 1 brother. Unfortunately, her brother died in a car accident when he was  29.   GYNECOLOGIC HISTORY:  No LMP recorded. Patient is postmenopausal. Menarche: 64 years old [place] P 0 LMP around age 27 Contraceptive: taken for 2 years with no complications HRT: no  Hysterectomy? no BSO? 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  Her elderly parents at this point are living with her, hoping to get into either [employer] or [employer] burn.  Her adopted daughter, Angel Gaines. age 17, will graduate from [employer] with a major in teaching May 2022.     ADVANCED DIRECTIVES: Her sister, [name], is her HCPOA.  [name] can be reached at [phone]   HEALTH MAINTENANCE: Social History   Tobacco Use  . Smoking status: Never Smoker  . Smokeless tobacco: Never Used  Vaping Use  . Vaping Use: Never used  Substance Use Topics  . Alcohol use: Yes    Alcohol/week: 2.0 standard drinks    Types: 2 Glasses of wine per week  . Drug use: No     Colonoscopy: due 2021  PAP: 04/30/2019, negative  Bone density: 2007, -1.1 (osteopenia)   No Known Allergies  Current Outpatient Medications  Medication Sig Dispense Refill  . aspirin EC 81 MG tablet Take 81 mg by mouth daily.    . Cholecalciferol (VITAMIN D) 2000 units CAPS Take 2,000 Units by mouth daily.    . Multiple Vitamin (MULTIVITAMIN) tablet Take 1 tablet by mouth daily.    . Omega-3 Fatty Acids (FISH OIL PO) Take by mouth.    . tamoxifen (NOLVADEX) 10 MG tablet Take 1 tablet (10 mg total) by mouth daily. 90 tablet 4   No current facility-administered medications for this visit.    OBJECTIVE: Middle-aged white woman who appears well  Vitals:   10/11/20 1514  BP: 138/79  Pulse: 71  Resp: 17  Temp: 99 F (37.2 C)  SpO2: 100%     Body mass index is 28.53 kg/m.   Wt Readings from Last 3 Encounters:  10/11/20 156 lb (70.8 kg)  10/04/19 158 lb 11.2 oz (72 kg)  07/15/19 152 lb 1.9 oz (69 kg)       ECOG FS:0 - Asymptomatic  Sclerae unicteric, EOMs intact Wearing a mask No cervical or supraclavicular adenopathy Lungs no rales or rhonchi Heart regular rate and rhythm Abd soft, nontender, positive bowel sounds MSK no focal spinal tenderness, no upper extremity lymphedema Neuro: nonfocal, well oriented, appropriate affect Breasts: The right breast is unremarkable.  The left breast is status post lumpectomy followed by radiation.  There is no evidence of recurrence.  Both axillae are benign.   LAB RESULTS:  CMP  No results found for: NA, K, CL, CO2, GLUCOSE, BUN, CREATININE, CALCIUM, PROT, ALBUMIN, AST, ALT, ALKPHOS, BILITOT, GFRNONAA, GFRAA  No results found for: TOTALPROTELP, ALBUMINELP, A1GS, A2GS, BETS, BETA2SER, GAMS, MSPIKE, SPEI  No results found for: KPAFRELGTCHN, LAMBDASER, KAPLAMBRATIO  No results found for: WBC, NEUTROABS, HGB, HCT, MCV, PLT  $Re'@LASTCHEMISTRY'lsC$ @  No results found for: LABCA2  No components found for: LNLGXQ119  No results for input(s): INR in the last 168 hours.  No results found for: LABCA2  No results found for: ERD408  No results found for: NIO270  No results found for: JJK093  No results found for: CA2729  No components found for: HGQUANT  No results found for: CEA1 / No results found for: CEA1   No results found for: AFPTUMOR  No results found for: CHROMOGRNA  No results found for: PSA1  No visits with results within 3 Day(s) from this visit.  Latest known visit with results is:  Lab on 01/26/2020  Component Date Value Ref Range Status  . SARS-CoV-2, NAA 01/26/2020 Not Detected  Not Detected Final   Comment: This nucleic acid amplification test was developed and its performance characteristics determined by [employer]. Nucleic acid amplification tests include RT-PCR and TMA. This test has not been FDA cleared or approved. This test has been authorized by FDA under an Emergency Use Authorization (EUA). This  test is only authorized for the duration of time the declaration that circumstances exist justifying the authorization of the emergency use of in vitro diagnostic tests for detection of SARS-CoV-2 virus and/or diagnosis of COVID-19 infection under section 564(b)(1) of the Act, 21 U.S.C. 818EXH-3(Z) (1), unless the authorization is terminated or revoked sooner. When diagnostic testing is negative, the possibility of a false negative result should be considered in the context of a patient's recent exposures and the presence of clinical signs and symptoms consistent with COVID-19. An individual without symptoms of COVID-19 and who is not shedding SARS-CoV-2 virus wo                          uld expect to have a negative (not detected) result in this assay.     (this displays the last labs from the last 3 days)  No results found for: TOTALPROTELP, ALBUMINELP, A1GS, A2GS, BETS, BETA2SER, GAMS, MSPIKE, SPEI (this displays SPEP labs)  No results found for: KPAFRELGTCHN, LAMBDASER, KAPLAMBRATIO (kappa/lambda light chains)  No results found for: HGBA, HGBA2QUANT, HGBFQUANT, HGBSQUAN (Hemoglobinopathy evaluation)   No results found for: LDH  No results found for: IRON, TIBC, IRONPCTSAT (Iron and TIBC)  No results found for: FERRITIN  Urinalysis No results found for: COLORURINE, APPEARANCEUR, LABSPEC, PHURINE, GLUCOSEU, HGBUR, BILIRUBINUR, KETONESUR, PROTEINUR, UROBILINOGEN, NITRITE, LEUKOCYTESUR   STUDIES: No results found.   ELIGIBLE FOR AVAILABLE RESEARCH PROTOCOL: no  ASSESSMENT: 64 y.o. [place] woman status post left breast biopsy 05/19/2019 for a 0.4 cm area of ductal carcinoma in situ, grade 3, estrogen and progesterone receptor positive  (1) s/p left lumpectomy 07/15/2019 showing no residual cancer  (2) adjuvant radiation 08/26/2019 - 09/22/2019  (a) Left breast / 42.56 Gy in 16 fractions  (b) Left breast boost / 8 Gy in 4 fractions  (3) tamoxifen started  neoadjuvantly, discontinued around the time of surgery, resumed 10/04/2019  (a) patient is taking the 10 mg daily dose in setting of DCIS  4) genetics testing 06/28/2019 through the Multi-Cancer Panel offered by [facility] found no deleterious mutations inAIP, ALK, APC, ATM, AXIN2,BAP1,  BARD1, BLM, BMPR1A, BRCA1, BRCA2, BRIP1, CASR, CDC73, CDH1, CDK4, CDKN1B, CDKN1C, CDKN2A (p14ARF), CDKN2A (p16INK4a), CEBPA, CHEK2, CTNNA1, DICER1, DIS3L2, EGFR (c.2369C>T, p.Thr790Met variant only), EPCAM (Deletion/duplication testing only), FH, FLCN, GATA2, GPC3, GREM1 (Promoter region deletion/duplication testing only), HOXB13 (c.251G>A, p.Gly84Glu), HRAS, KIT, MAX, MEN1, MET, MITF (c.952G>A, p.Glu318Lys variant only), MLH1, MSH2, MSH3, MSH6, MUTYH, NBN, NF1, NF2, NTHL1, PALB2, PDGFRA, PHOX2B, PMS2, POLD1, POLE, POT1, PRKAR1A, PTCH1, PTEN, RAD50, RAD51C, RAD51D, RB1, RECQL4, RET, RNF43, RUNX1, SDHAF2, SDHA (sequence changes only), SDHB, SDHC, SDHD, SMAD4, SMARCA4,  SMARCB1, SMARCE1, STK11, SUFU, TERC, TERT, TMEM127, TP53, TSC1, TSC2, VHL, WRN and WT1.    PLAN: Angel Gaines is now a little over a year out from definitive surgery for her breast cancer with no evidence of disease recurrence.  This is very favorable.  She is tolerating tamoxifen well enough that she is willing to continue it for a total of 5 years.  If she wished to do something about the hot flashes we could certainly consider low-dose venlafaxine.  For some reason her insurance refused to pay for her most recent mammogram.  I suspect that this may have been a billing issue or simply that her insurance did not know that this was a new baseline mammogram.  She did pay for it but she should try to get her money back  Otherwise I commended her exercise program, suggested she intensified if possible and scheduled her to see me again in 1 year  Total encounter time 25 minutes.Chauncey Cruel, MD   10/11/2020 4:14 PM Medical Oncology and Hematology [facility] [place], [place] [address] Tel. [phone]    Fax. [phone]   This document serves as a record of services personally performed by Lurline Del, MD. It was created on his behalf by Wilburn Mylar, a trained medical scribe. The creation of this record is based on the scribe's personal observations and the provider's statements to them.   I, Lurline Del MD, have reviewed the above documentation for accuracy and completeness, and I agree with the above.   *Total Encounter Time as defined by the Centers for Medicare and Medicaid Services includes, in addition to the face-to-face time of a patient visit (documented in the note above) non-face-to-face time: obtaining and reviewing outside history, ordering and reviewing medications, tests or procedures, care coordination (communications with other health care professionals or caregivers) and documentation in the medical record.

## 2020-10-13 ENCOUNTER — Telehealth: Payer: Self-pay | Admitting: Oncology

## 2020-10-13 NOTE — Telephone Encounter (Signed)
Scheduled appt per 11/3 los  - mailed reminder letter with appt date and time

## 2020-12-03 ENCOUNTER — Other Ambulatory Visit: Payer: Self-pay | Admitting: Oncology

## 2020-12-14 ENCOUNTER — Encounter: Payer: Self-pay | Admitting: Oncology

## 2021-03-05 ENCOUNTER — Encounter: Payer: Self-pay | Admitting: Oncology

## 2021-06-06 ENCOUNTER — Telehealth: Payer: Self-pay | Admitting: Oncology

## 2021-06-06 NOTE — Telephone Encounter (Signed)
Scheduled appt per 6/28 sch msg. Pt aware.  

## 2021-06-07 ENCOUNTER — Inpatient Hospital Stay: Payer: 59 | Attending: Oncology | Admitting: Oncology

## 2021-06-07 ENCOUNTER — Other Ambulatory Visit: Payer: Self-pay

## 2021-06-07 ENCOUNTER — Other Ambulatory Visit: Payer: Self-pay | Admitting: *Deleted

## 2021-06-07 VITALS — BP 145/72 | HR 88 | Temp 97.4°F | Resp 18 | Ht 62.0 in | Wt 156.1 lb

## 2021-06-07 DIAGNOSIS — Z170 Estrogen receptor positive status [ER+]: Secondary | ICD-10-CM | POA: Diagnosis not present

## 2021-06-07 DIAGNOSIS — Z801 Family history of malignant neoplasm of trachea, bronchus and lung: Secondary | ICD-10-CM

## 2021-06-07 DIAGNOSIS — D0512 Intraductal carcinoma in situ of left breast: Secondary | ICD-10-CM | POA: Diagnosis not present

## 2021-06-07 DIAGNOSIS — D696 Thrombocytopenia, unspecified: Secondary | ICD-10-CM

## 2021-06-07 DIAGNOSIS — Z79810 Long term (current) use of selective estrogen receptor modulators (SERMs): Secondary | ICD-10-CM

## 2021-06-07 DIAGNOSIS — Z803 Family history of malignant neoplasm of breast: Secondary | ICD-10-CM | POA: Diagnosis not present

## 2021-06-07 DIAGNOSIS — Z79899 Other long term (current) drug therapy: Secondary | ICD-10-CM

## 2021-06-07 DIAGNOSIS — Z8049 Family history of malignant neoplasm of other genital organs: Secondary | ICD-10-CM | POA: Diagnosis not present

## 2021-06-07 DIAGNOSIS — Z7982 Long term (current) use of aspirin: Secondary | ICD-10-CM | POA: Diagnosis not present

## 2021-06-07 DIAGNOSIS — C50412 Malignant neoplasm of upper-outer quadrant of left female breast: Secondary | ICD-10-CM

## 2021-06-07 LAB — CBC WITH DIFFERENTIAL/PLATELET
Abs Immature Granulocytes: 0.01 10*3/uL (ref 0.00–0.07)
Basophils Absolute: 0.1 10*3/uL (ref 0.0–0.1)
Basophils Relative: 1 %
Eosinophils Absolute: 0.2 10*3/uL (ref 0.0–0.5)
Eosinophils Relative: 3 %
HCT: 40.9 % (ref 36.0–46.0)
Hemoglobin: 14.3 g/dL (ref 12.0–15.0)
Immature Granulocytes: 0 %
Lymphocytes Relative: 29 %
Lymphs Abs: 2.3 10*3/uL (ref 0.7–4.0)
MCH: 30.4 pg (ref 26.0–34.0)
MCHC: 35 g/dL (ref 30.0–36.0)
MCV: 86.8 fL (ref 80.0–100.0)
Monocytes Absolute: 0.5 10*3/uL (ref 0.1–1.0)
Monocytes Relative: 6 %
Neutro Abs: 4.8 10*3/uL (ref 1.7–7.7)
Neutrophils Relative %: 61 %
Platelets: 123 10*3/uL — ABNORMAL LOW (ref 150–400)
RBC: 4.71 MIL/uL (ref 3.87–5.11)
RDW: 12.7 % (ref 11.5–15.5)
Smear Review: DECREASED
WBC: 7.9 10*3/uL (ref 4.0–10.5)
nRBC: 0 % (ref 0.0–0.2)

## 2021-06-07 LAB — CMP (CANCER CENTER ONLY)
ALT: 23 U/L (ref 0–44)
AST: 26 U/L (ref 15–41)
Albumin: 4.5 g/dL (ref 3.5–5.0)
Alkaline Phosphatase: 73 U/L (ref 38–126)
Anion gap: 9 (ref 5–15)
BUN: 22 mg/dL (ref 8–23)
CO2: 25 mmol/L (ref 22–32)
Calcium: 9.7 mg/dL (ref 8.9–10.3)
Chloride: 106 mmol/L (ref 98–111)
Creatinine: 0.86 mg/dL (ref 0.44–1.00)
GFR, Estimated: >60 mL/min (ref >60)
Glucose, Bld: 103 mg/dL — ABNORMAL HIGH (ref 70–99)
Potassium: 4.1 mmol/L (ref 3.5–5.1)
Sodium: 140 mmol/L (ref 135–145)
Total Bilirubin: 0.5 mg/dL (ref 0.3–1.2)
Total Protein: 7.4 g/dL (ref 6.5–8.1)

## 2021-06-07 LAB — SAVE SMEAR(SSMR), FOR PROVIDER SLIDE REVIEW

## 2021-06-07 LAB — PLATELET BY CITRATE

## 2021-06-07 MED ORDER — VENLAFAXINE HCL ER 37.5 MG PO CP24: 37.5000 mg | ORAL_CAPSULE | Freq: Every day | ORAL | 4 refills | Status: DC

## 2021-06-07 NOTE — Progress Notes (Signed)
[place]  [phone]) [phone] [phone]     ID: Angel Gaines DOB: 1956-10-04  MR#: [id number]  MPN#:[id number]  Patient Care Team: Angel Prose, MD as PCP - General (Family Medicine) Angel Gala, MD as Consulting Physician (Otolaryngology) Angel Gaines, Angel Dad, MD as Consulting Physician (Oncology) Angel Rudd, MD as Consulting Physician (Radiation Oncology) Angel Klein, MD as Consulting Physician (General Surgery) Angel Germany, RN as Oncology Nurse Navigator Angel Kaufmann, RN as Oncology Nurse Navigator Angel Cruel, MD OTHER MD:  CHIEF COMPLAINT: Ductal carcinoma in situ, left, estrogen receptor positive; thrombocytopenia  CURRENT TREATMENT: tamoxifen at the 10 mg dose   INTERVAL HISTORY: Angel Gaines returns today for a new problem.  She saw her primary care physician Dr. Nancy Gaines on 06/01/2021 and among other lab values as she was found to have a platelet count of 116,000.  On the same draw the white cell count was 6.5, and a hemoglobin of 14.1.  Other labs including a c-Met were unremarkable as well as a TSH and vitamin D.  She was referred here for further evaluation.  Follow up of her estrogen receptor positive noninvasive breast cancer.  Otherwise of course she was previously evaluated for her breast cancer.  She was restarted on tamoxifen at her last visit on 10/04/2019.  She tolerates this well, with some hot flashes which are milder than they were when she originally went through menopause.  She underwent mammogram at [facility] 02/28/2021 showing a breast density category C.  There was no evidence of malignancy.   REVIEW OF SYSTEMS: Angel Gaines tells me she has had no intercurrent infections that she is aware of.  She also has not started any new medications.  She takes a "bone health" pill from [facility] which includes calcium magnesium zinc vitamin D manganese B6 copper and boron.  She also takes turmeric although she has not been taking it for several  weeks.  She takes fish oil.  Otherwise she takes her medicines on our list.  She denies any nausea or vomiting abdominal distention cramps or change in bowel habits.  Detailed review of systems today was otherwise stable   COVID 19 VACCINATION STATUS: Status post [employer] x2, status post 1 booster as of June 2022   HISTORY OF CURRENT ILLNESS: From the original intake note:  "Angel Gaines" had routine screening mammography on 05/11/2018 showing a possible abnormality in the left breast. Diagnostic mammogram on 05/15/2018 showed calcifications in the left breast, and she was recommended to follow up in 6 months. These calcifications remained stable Deceber 2019 by report. She underwent bilateral diagnostic mammography with tomography and left breast ultrasonography at [facility] on 05/13/2019 showing: breast density category C; 4 mm area of grouped calcifications in the left breast upper outer aspect posterior depth. Left axilla ultrasound was performed on 05/19/2019 and showed no evidence of malignancy.  Accordingly on 05/19/2019 she proceeded to biopsy of the left breast area in question. The pathology from this procedure ([id number]) showed: ductal carcinoma in situ with calcifications, grade 3; fibrocystic changes with calcifications. Prognostic indicators significant for: estrogen receptor, 100% positive with strong staining intensity and progesterone receptor, 60% positive with moderate to weak staining intensity.   The patient's subsequent history is as detailed below.   PAST MEDICAL HISTORY: Past Medical History:  Diagnosis Date   Family history of breast cancer in female    Family history of cervical cancer    Family history of lung cancer    Pressure in head  PAST SURGICAL HISTORY: Past Surgical History:  Procedure Laterality Date   BREAST LUMPECTOMY WITH RADIOACTIVE SEED LOCALIZATION Left 07/15/2019   Procedure: LEFT BREAST LUMPECTOMY WITH RADIOACTIVE SEED LOCALIZATION;  Surgeon: Angel Klein, MD;   Location: [facility];  Service: General;  Laterality: Left;    FAMILY HISTORY: Family History  Problem Relation Age of Onset   Sarcoidosis Mother    Lung cancer Father 51       Smoker   Breast cancer Maternal Grandfather 77   Cervical cancer Maternal Aunt 68  The patient's father died at age 3.  He had renal cell carcinoma.Marland Kitchen He was adopted, so she doesn't know anything about his family. He also had a history of lung cancer and was a heavy smoker. Patient's mother is also living at age 60.  She currently resides in [place].  The patient's maternal grandfather was diagnosed with breast cancer.  The patient has 3 siblings, 2 sisters and 1 brother. Unfortunately, her brother died in a car accident when he was 9.   GYNECOLOGIC HISTORY:  No LMP recorded. Patient is postmenopausal. Menarche: 65 years old [place] P 0 LMP around age 68 Contraceptive: taken for 2 years with no complications HRT: no  Hysterectomy? no BSO? 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  Her adopted daughter, Angel Gaines. age 24, will graduate from [employer] with a major in teaching May 2022.     ADVANCED DIRECTIVES: Her sister, Angel Gaines, is her HCPOA.  Angel Gaines can be reached at [phone]   HEALTH MAINTENANCE: Social History   Tobacco Use   Smoking status: Never   Smokeless tobacco: Never  Vaping Use   Vaping Use: Never used  Substance Use Topics   Alcohol use: Yes    Alcohol/week: 2.0 standard drinks    Types: 2 Glasses of wine per week   Drug use: No     Colonoscopy: due 2021  PAP: 04/30/2019, negative  Bone density: 2007, -1.1 (osteopenia)   No Known Allergies  Current Outpatient Medications  Medication Sig Dispense Refill   aspirin EC 81 MG tablet Take 81 mg by mouth daily.     Cholecalciferol (VITAMIN D) 2000 units CAPS Take 2,000 Units by mouth daily.      Multiple Vitamin (MULTIVITAMIN) tablet Take 1 tablet by mouth daily.     Omega-3 Fatty Acids (FISH OIL PO) Take by mouth.     tamoxifen (NOLVADEX) 10 MG tablet TAKE 1 TABLET DAILY 90 tablet 3   No current facility-administered medications for this visit.    OBJECTIVE: Middle-aged white woman in no acute distress  Vitals:   06/07/21 1630  BP: (!) 145/72  Pulse: 88  Resp: 18  Temp: (!) 97.4 F (36.3 C)  SpO2: 100%      Body mass index is 28.55 kg/m.   Wt Readings from Last 3 Encounters:  06/07/21 156 lb 1.6 oz (70.8 kg)  10/11/20 156 lb (70.8 kg)  10/04/19 158 lb 11.2 oz (72 kg)     ECOG FS:0 - Asymptomatic  Sclerae unicteric, EOMs intact Wearing a mask No cervical or supraclavicular adenopathy Lungs no rales or rhonchi Heart regular rate and rhythm Abd soft, nontender, positive bowel sounds, no palpable splenomegaly MSK no focal spinal tenderness, no upper extremity lymphedema Neuro: nonfocal, well oriented, appropriate affect Breasts: Deferred   LAB RESULTS:  CMP     Component Value  Date/Time   NA 140 06/07/2021 1641   K 4.1 06/07/2021 1641   CL 106 06/07/2021 1641   CO2 25 06/07/2021 1641   GLUCOSE 103 (H) 06/07/2021 1641   BUN 22 06/07/2021 1641   CREATININE 0.86 06/07/2021 1641   CALCIUM 9.7 06/07/2021 1641   PROT 7.4 06/07/2021 1641   ALBUMIN 4.5 06/07/2021 1641   AST 26 06/07/2021 1641   ALT 23 06/07/2021 1641   ALKPHOS 73 06/07/2021 1641   BILITOT 0.5 06/07/2021 1641   GFRNONAA >60 06/07/2021 1641    Lab Results  Component Value Date   WBC 7.9 06/07/2021   NEUTROABS 4.8 06/07/2021   HGB 14.3 06/07/2021   HCT 40.9 06/07/2021   MCV 86.8 06/07/2021   PLT 123 (L) 06/07/2021    No results found for: LABCA2  No components found for: GQQPYP950  No results for input(s): INR in the last 168 hours.  No results found for: LABCA2  No results found for: DTO671  No results found for: IWP809  No results found for: XIP382  No results found  for: CA2729  No components found for: HGQUANT  No results found for: CEA1 / No results found for: CEA1   No results found for: AFPTUMOR  No results found for: CHROMOGRNA  No results found for: TOTALPROTELP, ALBUMINELP, A1GS, A2GS, BETS, BETA2SER, GAMS, MSPIKE, SPEI (this displays SPEP labs)  No results found for: KPAFRELGTCHN, LAMBDASER, KAPLAMBRATIO (kappa/lambda light chains)  No results found for: HGBA, HGBA2QUANT, HGBFQUANT, HGBSQUAN (Hemoglobinopathy evaluation)   No results found for: LDH  No results found for: IRON, TIBC, IRONPCTSAT (Iron and TIBC)  No results found for: FERRITIN  Urinalysis No results found for: COLORURINE, APPEARANCEUR, LABSPEC, PHURINE, GLUCOSEU, HGBUR, BILIRUBINUR, KETONESUR, PROTEINUR, UROBILINOGEN, NITRITE, LEUKOCYTESUR   STUDIES: No results found.   ELIGIBLE FOR AVAILABLE RESEARCH PROTOCOL: no  ASSESSMENT: 65 y.o. [place] woman status post left breast biopsy 05/19/2019 for a 0.4 cm area of ductal carcinoma in situ, grade 3, estrogen and progesterone receptor positive  (1) s/p left lumpectomy 07/15/2019 showing no residual cancer  (2) adjuvant radiation 08/26/2019 - 09/22/2019  (a) Left breast / 42.56 Gy in 16 fractions  (b) Left breast boost / 8 Gy in 4 fractions  (3) tamoxifen started neoadjuvantly, discontinued around the time of surgery, resumed 10/04/2019  (a) patient is taking the 10 mg daily dose in setting of DCIS  4) genetics testing 06/28/2019 through the Multi-Cancer Panel offered by [facility] found no deleterious mutations inAIP, ALK, APC, ATM, AXIN2,BAP1,  BARD1, BLM, BMPR1A, BRCA1, BRCA2, BRIP1, CASR, CDC73, CDH1, CDK4, CDKN1B, CDKN1C, CDKN2A (p14ARF), CDKN2A (p16INK4a), CEBPA, CHEK2, CTNNA1, DICER1, DIS3L2, EGFR (c.2369C>T, p.Thr790Met variant only), EPCAM (Deletion/duplication testing only), FH, FLCN, GATA2, GPC3, GREM1 (Promoter region deletion/duplication testing only), HOXB13 (c.251G>A, p.Gly84Glu), HRAS, KIT, MAX, MEN1,  MET, MITF (c.952G>A, p.Glu318Lys variant only), MLH1, MSH2, MSH3, MSH6, MUTYH, NBN, NF1, NF2, NTHL1, PALB2, PDGFRA, PHOX2B, PMS2, POLD1, POLE, POT1, PRKAR1A, PTCH1, PTEN, RAD50, RAD51C, RAD51D, RB1, RECQL4, RET, RNF43, RUNX1, SDHAF2, SDHA (sequence changes only), SDHB, SDHC, SDHD, SMAD4, SMARCA4, SMARCB1, SMARCE1, STK11, SUFU, TERC, TERT, TMEM127, TP53, TSC1, TSC2, VHL, WRN and WT1.   (5) thrombocytopenia: Platelet count of 116,000 noted by primary care physician 06/01/2021, repeat 123,000 on 06/07/2021   PLAN: We discussed the fact that all blood cells are made in the marrow bones.  Her white cells are normal in number.  I have not yet been able to do a smear review but the differential is normal.  Similarly her red  cells are normal in number and the MCV is normal.  If 2 of the cell lines are normal it is unlikely we are dealing with a primary bone marrow issue and we need to find a platelet specific cause for the thrombocytopenia.  We obtained a repeat platelet count and a citrated tube and this was consistent with the EDTA.  On physical exam there is no palpable splenomegaly.  There have been no intercurrent infections and no new medications although there is boron in her "bone medicine" and I do not know whether that could possibly have an effect on her platelet count.  In the absence of artifactual or external causes from the mild thrombocytopenia (such as infections or medications as noted above) then we have to assess whether she is under producing platelets or destroying platelets.  The latter is much more common.  It could be that she is developing a mild case of chronic ITP.  We discussed the fact that ITP does not require treatment so long as the platelet count is 50,000 or greater.  Accordingly at this point all she needs is observation.  If this is transient then when we repeat her labs in about a month hopefully things will have normalized.  We will not have an answer as to why her platelets  dipped temporarily but the problem will be solved.  If the platelets remain on the low side however she would need longer-term follow-up.  What she does not need to do is to change her activities.  She is planning a trip to [place] tomorrow and she certainly can do that.  She understands that the aspirin she is taking and turmeric as well both affect platelets and can lead to bruising.  She has occasional epistaxis but that is not because she has a minimally low platelet count: With a platelet count greater than 100,000 there is no bleeding diathesis  This discussion was reassuring to her.  We we will repeat a platelet count late July and take it from there.  Incidentally she was also having significant hot flashes during our visit.  I am prescribing low-dose venlafaxine for her  Total encounter time 55 minutes.Angel Cruel, MD   06/07/2021 5:51 PM Medical Oncology and Hematology [facility] [place], [place] [address] Tel. [phone]    Fax. [phone]   This document serves as a record of services personally performed by Lurline Del, MD. It was created on his behalf by Wilburn Mylar, a trained medical scribe. The creation of this record is based on the scribe's personal observations and the provider's statements to them.   I, Lurline Del MD, have reviewed the above documentation for accuracy and completeness, and I agree with the above.   *Total Encounter Time as defined by the Centers for Medicare and Medicaid Services includes, in addition to the face-to-face time of a patient visit (documented in the note above) non-face-to-face time: obtaining and reviewing outside history, ordering and reviewing medications, tests or procedures, care coordination (communications with other health care professionals or caregivers) and documentation in the medical record.

## 2021-06-07 NOTE — Addendum Note (Signed)
Addended by: Chauncey Cruel on: 06/07/2021 06:16 PM   Modules accepted: Orders

## 2021-07-03 ENCOUNTER — Other Ambulatory Visit: Payer: Self-pay

## 2021-07-03 ENCOUNTER — Inpatient Hospital Stay: Payer: 59 | Attending: Oncology

## 2021-07-03 DIAGNOSIS — Z170 Estrogen receptor positive status [ER+]: Secondary | ICD-10-CM

## 2021-07-03 DIAGNOSIS — D696 Thrombocytopenia, unspecified: Secondary | ICD-10-CM | POA: Diagnosis present

## 2021-07-03 DIAGNOSIS — C50412 Malignant neoplasm of upper-outer quadrant of left female breast: Secondary | ICD-10-CM | POA: Insufficient documentation

## 2021-07-03 LAB — CBC WITH DIFFERENTIAL/PLATELET
Abs Immature Granulocytes: 0.05 10*3/uL (ref 0.00–0.07)
Basophils Absolute: 0.1 10*3/uL (ref 0.0–0.1)
Basophils Relative: 1 %
Eosinophils Absolute: 0.2 10*3/uL (ref 0.0–0.5)
Eosinophils Relative: 3 %
HCT: 39.2 % (ref 36.0–46.0)
Hemoglobin: 13.6 g/dL (ref 12.0–15.0)
Immature Granulocytes: 1 %
Lymphocytes Relative: 29 %
Lymphs Abs: 2.2 10*3/uL (ref 0.7–4.0)
MCH: 29.8 pg (ref 26.0–34.0)
MCHC: 34.7 g/dL (ref 30.0–36.0)
MCV: 86 fL (ref 80.0–100.0)
Monocytes Absolute: 0.4 10*3/uL (ref 0.1–1.0)
Monocytes Relative: 5 %
Neutro Abs: 4.6 10*3/uL (ref 1.7–7.7)
Neutrophils Relative %: 61 %
Platelets: 106 10*3/uL — ABNORMAL LOW (ref 150–400)
RBC: 4.56 MIL/uL (ref 3.87–5.11)
RDW: 12.7 % (ref 11.5–15.5)
WBC: 7.5 10*3/uL (ref 4.0–10.5)
nRBC: 0 % (ref 0.0–0.2)

## 2021-07-10 ENCOUNTER — Other Ambulatory Visit: Payer: Self-pay | Admitting: Oncology

## 2021-07-10 DIAGNOSIS — D696 Thrombocytopenia, unspecified: Secondary | ICD-10-CM

## 2021-07-10 NOTE — Progress Notes (Signed)
I called Angel Gaines to review with her the results of her repeat labs which again show a normal white cells and hemoglobin but a platelet count of 106,000.  Review of the blood film shows no artifactual clumps  I think she has chronic ITP.  We discussed this over the phone.  This requires no intervention Zolinza platelet count is greater than 50,000.  I suggest that if she was on any supplements she should discontinue them but she tells me she is not on any supplements  She did tell me she had COVID on July 10.  She did well with that, with a little bit of congestion headache and mild fever for 1 day.  Recall she has been vaccinated previously already had 1 booster on board  She sees me again 10/15/2021 and will have labs again that day.

## 2021-07-11 ENCOUNTER — Telehealth: Payer: Self-pay | Admitting: Oncology

## 2021-07-11 NOTE — Telephone Encounter (Signed)
Scheduled appt per 8/1 sch msg. Pt aware.  

## 2021-10-15 ENCOUNTER — Other Ambulatory Visit: Payer: Self-pay

## 2021-10-15 ENCOUNTER — Inpatient Hospital Stay: Payer: 59

## 2021-10-15 ENCOUNTER — Inpatient Hospital Stay: Payer: 59 | Attending: Oncology | Admitting: Oncology

## 2021-10-15 VITALS — BP 151/70 | HR 68 | Temp 97.7°F | Resp 16 | Ht 62.0 in | Wt 158.5 lb

## 2021-10-15 DIAGNOSIS — C50912 Malignant neoplasm of unspecified site of left female breast: Secondary | ICD-10-CM | POA: Diagnosis present

## 2021-10-15 DIAGNOSIS — Z170 Estrogen receptor positive status [ER+]: Secondary | ICD-10-CM

## 2021-10-15 DIAGNOSIS — C50412 Malignant neoplasm of upper-outer quadrant of left female breast: Secondary | ICD-10-CM | POA: Diagnosis not present

## 2021-10-15 DIAGNOSIS — Z8049 Family history of malignant neoplasm of other genital organs: Secondary | ICD-10-CM | POA: Diagnosis not present

## 2021-10-15 DIAGNOSIS — Z801 Family history of malignant neoplasm of trachea, bronchus and lung: Secondary | ICD-10-CM | POA: Diagnosis not present

## 2021-10-15 DIAGNOSIS — Z8051 Family history of malignant neoplasm of kidney: Secondary | ICD-10-CM | POA: Insufficient documentation

## 2021-10-15 DIAGNOSIS — D696 Thrombocytopenia, unspecified: Secondary | ICD-10-CM | POA: Diagnosis not present

## 2021-10-15 DIAGNOSIS — D693 Immune thrombocytopenic purpura: Secondary | ICD-10-CM | POA: Diagnosis not present

## 2021-10-15 DIAGNOSIS — Z803 Family history of malignant neoplasm of breast: Secondary | ICD-10-CM | POA: Insufficient documentation

## 2021-10-15 LAB — CBC WITH DIFFERENTIAL/PLATELET
Abs Immature Granulocytes: 0.01 10*3/uL (ref 0.00–0.07)
Basophils Absolute: 0.1 10*3/uL (ref 0.0–0.1)
Basophils Relative: 1 %
Eosinophils Absolute: 0.2 10*3/uL (ref 0.0–0.5)
Eosinophils Relative: 4 %
HCT: 37.7 % (ref 36.0–46.0)
Hemoglobin: 13 g/dL (ref 12.0–15.0)
Immature Granulocytes: 0 %
Lymphocytes Relative: 34 %
Lymphs Abs: 2 10*3/uL (ref 0.7–4.0)
MCH: 30.1 pg (ref 26.0–34.0)
MCHC: 34.5 g/dL (ref 30.0–36.0)
MCV: 87.3 fL (ref 80.0–100.0)
Monocytes Absolute: 0.4 10*3/uL (ref 0.1–1.0)
Monocytes Relative: 7 %
Neutro Abs: 3.1 10*3/uL (ref 1.7–7.7)
Neutrophils Relative %: 54 %
Platelets: 119 10*3/uL — ABNORMAL LOW (ref 150–400)
RBC: 4.32 MIL/uL (ref 3.87–5.11)
RDW: 12.9 % (ref 11.5–15.5)
WBC: 5.8 10*3/uL (ref 4.0–10.5)
nRBC: 0 % (ref 0.0–0.2)

## 2021-10-15 LAB — SAVE SMEAR(SSMR), FOR PROVIDER SLIDE REVIEW

## 2021-10-15 MED ORDER — TAMOXIFEN CITRATE 10 MG PO TABS: 10.0000 mg | ORAL_TABLET | Freq: Two times a day (BID) | ORAL | 4 refills | Status: DC

## 2021-10-15 NOTE — Progress Notes (Signed)
[place]  Telephone:(336) [phone] Fax:(336) [phone]     ID: Angel Gaines DOB: 1956/11/21  MR#: [id number]  GQB#:[id number]  Patient Care Team: Donald Prose, MD as PCP - General (Family Medicine) Izora Gala, MD as Consulting Physician (Otolaryngology) Ronav Furney, Virgie Dad, MD as Consulting Physician (Oncology) Kyung Rudd, MD as Consulting Physician (Radiation Oncology) Stark Klein, MD as Consulting Physician (General Surgery) Rockwell Germany, RN as Oncology Nurse Navigator Mauro Kaufmann, RN as Oncology Nurse Navigator Chauncey Cruel, MD OTHER MD:  CHIEF COMPLAINT: Ductal carcinoma in situ, estrogen receptor positive; thrombocytopenia  CURRENT TREATMENT: tamoxifen at the 5 mg dose   INTERVAL HISTORY: Angel Gaines returns today for follow up of her noninvasive breast cancer and thrombocytopenia.  She restarted tamoxifen on 10/04/2019.  She is having trouble tolerating the 10 mg dose, hot flashes being the main issue.  They are worse in the summer.  Fortunately this does not significantly interrupt her sleep.  Her most recent mammogram at [facility] 02/28/2021 showed breast density category C and no evidence of malignancy.  In addition we are following her platelet count.  This has been consistently above 100,000 and below 130,000. Results for [name], [name]" (MRN [id number]) as of 10/15/2021 17:59  Ref. Range 06/07/2021 16:41 07/03/2021 13:50 10/15/2021 14:35  Platelets Latest Ref Range: 150 - 400 K/uL 123 (L) 106 (L) 119 (L)     REVIEW OF SYSTEMS: Angel Gaines gave Effexor and tried but after only 2 doses she felt strange and discontinued that.  She keeps a normal functional status otherwise and a detailed review of systems today was not short   COVID 19 VACCINATION STATUS: Status post [place] x2, status post 1 booster as of June 2022; infection 06/2021   HISTORY OF CURRENT ILLNESS: From the original intake note:  "Angel Gaines" had routine screening mammography  on 05/11/2018 showing a possible abnormality in the left breast. Diagnostic mammogram on 05/15/2018 showed calcifications in the left breast, and she was recommended to follow up in 6 months. These calcifications remained stable Deceber 2019 by report. She underwent bilateral diagnostic mammography with tomography and left breast ultrasonography at [facility] on 05/13/2019 showing: breast density category C; 4 mm area of grouped calcifications in the left breast upper outer aspect posterior depth. Left axilla ultrasound was performed on 05/19/2019 and showed no evidence of malignancy.  Accordingly on 05/19/2019 she proceeded to biopsy of the left breast area in question. The pathology from this procedure ([id number]) showed: ductal carcinoma in situ with calcifications, grade 3; fibrocystic changes with calcifications. Prognostic indicators significant for: estrogen receptor, 100% positive with strong staining intensity and progesterone receptor, 60% positive with moderate to weak staining intensity.   The patient's subsequent history is as detailed below.   PAST MEDICAL HISTORY: Past Medical History:  Diagnosis Date   Family history of breast cancer in female    Family history of cervical cancer    Family history of lung cancer    Pressure in head     PAST SURGICAL HISTORY: Past Surgical History:  Procedure Laterality Date   BREAST LUMPECTOMY WITH RADIOACTIVE SEED LOCALIZATION Left 07/15/2019   Procedure: LEFT BREAST LUMPECTOMY WITH RADIOACTIVE SEED LOCALIZATION;  Surgeon: Stark Klein, MD;  Location: [place];  Service: General;  Laterality: Left;    FAMILY HISTORY: Family History  Problem Relation Age of Onset   Sarcoidosis Mother    Lung cancer Father 56       Smoker   Breast cancer Maternal Grandfather  77   Cervical cancer Maternal Aunt 68  The patient's father died at age 84.  He had renal cell carcinoma.Marland Kitchen He was adopted, so she doesn't know anything about his family. He also  had a history of lung cancer and was a heavy smoker. Patient's mother is also living at age 48.  She currently resides in [place].  The patient's maternal grandfather was diagnosed with breast cancer.  The patient has 3 siblings, 2 sisters and 1 brother. Unfortunately, her brother died in a car accident when he was 38.   GYNECOLOGIC HISTORY:  No LMP recorded. Patient is postmenopausal. Menarche: 65 years old [place] P 0 LMP around age 89 Contraceptive: taken for 2 years with no complications HRT: no  Hysterectomy? no BSO? 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  Her adopted daughter, [name]. age 19, will graduate from [employer] with a major in teaching May 2022.     ADVANCED DIRECTIVES: Her sister, [name], is her HCPOA.  [name] can be reached at [phone]   HEALTH MAINTENANCE: Social History   Tobacco Use   Smoking status: Never   Smokeless tobacco: Never  Vaping Use   Vaping Use: Never used  Substance Use Topics   Alcohol use: Yes    Alcohol/week: 2.0 standard drinks    Types: 2 Glasses of wine per week   Drug use: No     Colonoscopy: due 2021  PAP: 04/30/2019, negative  Bone density: 2007, -1.1 (osteopenia)   No Known Allergies  Current Outpatient Medications  Medication Sig Dispense Refill   tamoxifen (NOLVADEX) 10 MG tablet Take 1 tablet (10 mg total) by mouth 2 (two) times daily. 90 tablet 4   Cholecalciferol (VITAMIN D) 2000 units CAPS Take 2,000 Units by mouth daily.     Multiple Vitamin (MULTIVITAMIN) tablet Take 1 tablet by mouth daily.     Omega-3 Fatty Acids (FISH OIL PO) Take by mouth.     tamoxifen (NOLVADEX) 10 MG tablet TAKE 1 TABLET DAILY 90 tablet 3   No current facility-administered medications for this visit.    OBJECTIVE: Middle-aged white woman who appears well  Vitals:   10/15/21 1527  BP: (!) 151/70   Pulse: 68  Resp: 16  Temp: 97.7 F (36.5 C)  SpO2: 100%       Body mass index is 28.99 kg/m.   Wt Readings from Last 3 Encounters:  10/15/21 158 lb 8 oz (71.9 kg)  06/07/21 156 lb 1.6 oz (70.8 kg)  10/11/20 156 lb (70.8 kg)     ECOG FS:0 - Asymptomatic  Sclerae unicteric, EOMs intact Wearing a mask No cervical or supraclavicular adenopathy Lungs no rales or rhonchi Heart regular rate and rhythm Abd soft, nontender, positive bowel sounds MSK no focal spinal tenderness, no upper extremity lymphedema Neuro: nonfocal, well oriented, appropriate affect Breasts: The right breast is benign per the left breast is status postlumpectomy and radiation.  There is no evidence of local recurrence.  Both axillae are benign   LAB RESULTS:  CMP     Component Value Date/Time   NA 140 06/07/2021 1641   K 4.1 06/07/2021 1641   CL 106 06/07/2021 1641   CO2 25 06/07/2021 1641   GLUCOSE 103 (H) 06/07/2021 1641   BUN 22 06/07/2021 1641   CREATININE 0.86 06/07/2021 1641   CALCIUM 9.7 06/07/2021 1641   PROT 7.4  06/07/2021 1641   ALBUMIN 4.5 06/07/2021 1641   AST 26 06/07/2021 1641   ALT 23 06/07/2021 1641   ALKPHOS 73 06/07/2021 1641   BILITOT 0.5 06/07/2021 1641   GFRNONAA >60 06/07/2021 1641    Lab Results  Component Value Date   WBC 5.8 10/15/2021   NEUTROABS 3.1 10/15/2021   HGB 13.0 10/15/2021   HCT 37.7 10/15/2021   MCV 87.3 10/15/2021   PLT 119 (L) 10/15/2021    No results found for: LABCA2  No components found for: CYELYH909  No results for input(s): INR in the last 168 hours.  No results found for: LABCA2  No results found for: PJP216  No results found for: KOE695  No results found for: QHK257  No results found for: CA2729  No components found for: HGQUANT  No results found for: CEA1 / No results found for: CEA1   No results found for: AFPTUMOR  No results found for: CHROMOGRNA  No results found for: TOTALPROTELP, ALBUMINELP, A1GS, A2GS, BETS,  BETA2SER, GAMS, MSPIKE, SPEI (this displays SPEP labs)  No results found for: KPAFRELGTCHN, LAMBDASER, KAPLAMBRATIO (kappa/lambda light chains)  No results found for: HGBA, HGBA2QUANT, HGBFQUANT, HGBSQUAN (Hemoglobinopathy evaluation)   No results found for: LDH  No results found for: IRON, TIBC, IRONPCTSAT (Iron and TIBC)  No results found for: FERRITIN  Urinalysis No results found for: COLORURINE, APPEARANCEUR, LABSPEC, PHURINE, GLUCOSEU, HGBUR, BILIRUBINUR, KETONESUR, PROTEINUR, UROBILINOGEN, NITRITE, LEUKOCYTESUR   STUDIES: No results found.   ELIGIBLE FOR AVAILABLE RESEARCH PROTOCOL: no  ASSESSMENT: 65 y.o. [place] woman status post left breast biopsy 05/19/2019 for a 0.4 cm area of ductal carcinoma in situ, grade 3, estrogen and progesterone receptor positive  (1) s/p left lumpectomy 07/15/2019 showing no residual cancer  (2) adjuvant radiation 08/26/2019 - 09/22/2019  (a) Left breast / 42.56 Gy in 16 fractions  (b) Left breast boost / 8 Gy in 4 fractions  (3) tamoxifen started neoadjuvantly, discontinued around the time of surgery, resumed 10/04/2019  (a) patient is taking the 10 mg daily dose in setting of DCIS  4) genetics testing 06/28/2019 through the Multi-Cancer Panel offered by [facility] found no deleterious mutations inAIP, ALK, APC, ATM, AXIN2,BAP1,  BARD1, BLM, BMPR1A, BRCA1, BRCA2, BRIP1, CASR, CDC73, CDH1, CDK4, CDKN1B, CDKN1C, CDKN2A (p14ARF), CDKN2A (p16INK4a), CEBPA, CHEK2, CTNNA1, DICER1, DIS3L2, EGFR (c.2369C>T, p.Thr790Met variant only), EPCAM (Deletion/duplication testing only), FH, FLCN, GATA2, GPC3, GREM1 (Promoter region deletion/duplication testing only), HOXB13 (c.251G>A, p.Gly84Glu), HRAS, KIT, MAX, MEN1, MET, MITF (c.952G>A, p.Glu318Lys variant only), MLH1, MSH2, MSH3, MSH6, MUTYH, NBN, NF1, NF2, NTHL1, PALB2, PDGFRA, PHOX2B, PMS2, POLD1, POLE, POT1, PRKAR1A, PTCH1, PTEN, RAD50, RAD51C, RAD51D, RB1, RECQL4, RET, RNF43, RUNX1, SDHAF2, SDHA  (sequence changes only), SDHB, SDHC, SDHD, SMAD4, SMARCA4, SMARCB1, SMARCE1, STK11, SUFU, TERC, TERT, TMEM127, TP53, TSC1, TSC2, VHL, WRN and WT1.   (5) chronic immune thrombocytopenia: Platelet count of 116,000 noted by primary care physician 06/01/2021, repeat since then 123K, 106K, and 119K   PLAN: Angel Gaines is struggling to continue tamoxifen at the 10 mg dose.  We discussed going down to 5 mg.  She understands the data for this is scant, 1 study with a few 100 patients, nevertheless since she had noninvasive disease I think this may be a better choice for her so we are going to go with 5 mg daily beginning tomorrow.  I have asked her to call us in a few weeks to let us know if that has solved the problem for her.  If.  Does not  make much difference we will go back to 10 mg.  I also reassured her regarding the mild thrombocytopenia.  She is not at risk of bleeding with a platelet count greater than 100,000.  This does not appear to be progressive.  It requires no intervention at this point but only follow-up.  I suggest that she see [place] in April, shortly after her mammogram, and then see her primary care physician 6 months later.  If she does this routinely she will have a CBC every 6 months indefinitely and that would be adequate as far as following the thrombocytopenia is concerned.  Of course if she develops significant bleeding or bruising she would call us  Total encounter time 25 minutes.Chauncey Cruel, MD   10/15/2021 5:58 PM Medical Oncology and Hematology [facility] [place], [place] [address] Tel. [phone]    Fax. [phone]   This document serves as a record of services personally performed by Lurline Del, MD. It was created on his behalf by Wilburn Mylar, a trained medical scribe. The creation of this record is based on the scribe's personal observations and the provider's statements to them.   I, Lurline Del MD, have  reviewed the above documentation for accuracy and completeness, and I agree with the above.   *Total Encounter Time as defined by the Centers for Medicare and Medicaid Services includes, in addition to the face-to-face time of a patient visit (documented in the note above) non-face-to-face time: obtaining and reviewing outside history, ordering and reviewing medications, tests or procedures, care coordination (communications with other health care professionals or caregivers) and documentation in the medical record.

## 2021-11-21 ENCOUNTER — Other Ambulatory Visit: Payer: Self-pay | Admitting: Adult Health

## 2022-03-08 ENCOUNTER — Telehealth: Payer: Self-pay | Admitting: Hematology and Oncology

## 2022-03-08 NOTE — Telephone Encounter (Signed)
R/s pt's appt time with Dr. Chryl Heck. Called pt, no answer. Left msg with new appt time.  ?

## 2022-03-18 ENCOUNTER — Other Ambulatory Visit: Payer: Self-pay

## 2022-03-18 DIAGNOSIS — Z170 Estrogen receptor positive status [ER+]: Secondary | ICD-10-CM

## 2022-03-19 ENCOUNTER — Other Ambulatory Visit: Payer: 59

## 2022-03-19 ENCOUNTER — Inpatient Hospital Stay: Payer: Managed Care, Other (non HMO) | Attending: Hematology and Oncology | Admitting: Hematology and Oncology

## 2022-03-19 ENCOUNTER — Ambulatory Visit: Payer: 59 | Admitting: Hematology and Oncology

## 2022-03-19 ENCOUNTER — Other Ambulatory Visit: Payer: Self-pay

## 2022-03-19 ENCOUNTER — Encounter: Payer: Self-pay | Admitting: Hematology and Oncology

## 2022-03-19 ENCOUNTER — Inpatient Hospital Stay: Payer: Managed Care, Other (non HMO)

## 2022-03-19 VITALS — BP 147/76 | HR 79 | Temp 97.9°F | Resp 18 | Ht 62.0 in | Wt 145.3 lb

## 2022-03-19 DIAGNOSIS — C50412 Malignant neoplasm of upper-outer quadrant of left female breast: Secondary | ICD-10-CM

## 2022-03-19 DIAGNOSIS — Z170 Estrogen receptor positive status [ER+]: Secondary | ICD-10-CM

## 2022-03-19 DIAGNOSIS — D696 Thrombocytopenia, unspecified: Secondary | ICD-10-CM | POA: Diagnosis not present

## 2022-03-19 DIAGNOSIS — Z808 Family history of malignant neoplasm of other organs or systems: Secondary | ICD-10-CM | POA: Diagnosis not present

## 2022-03-19 DIAGNOSIS — Z801 Family history of malignant neoplasm of trachea, bronchus and lung: Secondary | ICD-10-CM | POA: Insufficient documentation

## 2022-03-19 DIAGNOSIS — Z853 Personal history of malignant neoplasm of breast: Secondary | ICD-10-CM | POA: Insufficient documentation

## 2022-03-19 DIAGNOSIS — Z803 Family history of malignant neoplasm of breast: Secondary | ICD-10-CM | POA: Insufficient documentation

## 2022-03-19 DIAGNOSIS — D693 Immune thrombocytopenic purpura: Secondary | ICD-10-CM | POA: Diagnosis present

## 2022-03-19 LAB — CBC WITH DIFFERENTIAL (CANCER CENTER ONLY)
Abs Immature Granulocytes: 0.02 10*3/uL (ref 0.00–0.07)
Basophils Absolute: 0.1 10*3/uL (ref 0.0–0.1)
Basophils Relative: 1 %
Eosinophils Absolute: 0.2 10*3/uL (ref 0.0–0.5)
Eosinophils Relative: 3 %
HCT: 40.4 % (ref 36.0–46.0)
Hemoglobin: 13.6 g/dL (ref 12.0–15.0)
Immature Granulocytes: 0 %
Lymphocytes Relative: 27 %
Lymphs Abs: 1.9 10*3/uL (ref 0.7–4.0)
MCH: 29.9 pg (ref 26.0–34.0)
MCHC: 33.7 g/dL (ref 30.0–36.0)
MCV: 88.8 fL (ref 80.0–100.0)
Monocytes Absolute: 0.4 10*3/uL (ref 0.1–1.0)
Monocytes Relative: 6 %
Neutro Abs: 4.3 10*3/uL (ref 1.7–7.7)
Neutrophils Relative %: 63 %
Platelet Count: 122 10*3/uL — ABNORMAL LOW (ref 150–400)
RBC: 4.55 MIL/uL (ref 3.87–5.11)
RDW: 13.7 % (ref 11.5–15.5)
WBC Count: 6.9 10*3/uL (ref 4.0–10.5)
nRBC: 0 % (ref 0.0–0.2)

## 2022-03-19 NOTE — Progress Notes (Signed)
?Angel Gaines  ?Telephone:(336) [phone] [phone]  ? ? ? ?ID: Angel Gaines DOB: [birth year]  MR#: [id number]  KTG#:[id number] ? ?Patient Care Team: ?Angel Prose, MD as PCP - General (Family Medicine) ?Angel Gala, MD as Consulting Physician (Otolaryngology) ?Magrinat, Virgie Dad, MD (Inactive) as Consulting Physician (Oncology) ?Angel Rudd, MD as Consulting Physician (Radiation Oncology) ?Angel Klein, MD as Consulting Physician (General Surgery) ?Angel Germany, RN as Oncology Nurse Navigator ?Angel Kaufmann, RN as Oncology Nurse Navigator ?Angel Pike, MD ?OTHER MD: ? ?CHIEF COMPLAINT: Ductal carcinoma in situ, estrogen receptor positive; thrombocytopenia ? ?CURRENT TREATMENT: tamoxifen at the 5 mg dose ? ? ?INTERVAL HISTORY: ? ?Angel Gaines returns today for follow up of her noninvasive breast cancer and thrombocytopenia. ?She is taking Tamoxifen 5 mg po daily. She says hot flashes are better on low dose tamoxifen ?She denies any bleeding. She is worried about thrombocytopenia. ? ?Her most recent mammogram at [facility] 03/05/2022 showed breast density category C and no evidence of malignancy. ?No other changes ?Rest of the pertinent 10 point ROS reviewed and negative. ? ? ? COVID 19 VACCINATION STATUS: Status post [place] x2, status post 1 booster as of June 2022; infection 06/2021 ? ? ?HISTORY OF CURRENT ILLNESS: ?From the original intake note: ? ?"Angel Gaines" had routine screening mammography on 05/11/2018 showing a possible abnormality in the left breast. Diagnostic mammogram on 05/15/2018 showed calcifications in the left breast, and she was recommended to follow up in 6 months. These calcifications remained stable Deceber 2019 by report. She underwent bilateral diagnostic mammography with tomography and left breast ultrasonography at [facility] on 05/13/2019 showing: breast density category C; 4 mm area of grouped calcifications in the left breast upper outer aspect posterior depth. Left axilla ultrasound  was performed on 05/19/2019 and showed no evidence of malignancy. ? ?Accordingly on 05/19/2019 she proceeded to biopsy of the left breast area in question. The pathology from this procedure ([id number]) showed: ductal carcinoma in situ with calcifications, grade 3; fibrocystic changes with calcifications. Prognostic indicators significant for: estrogen receptor, 100% positive with strong staining intensity and progesterone receptor, 60% positive with moderate to weak staining intensity.  ? ?The patient's subsequent history is as detailed below. ? ? ?PAST MEDICAL HISTORY: ?Past Medical History:  ?Diagnosis Date  ? Family history of breast cancer in female   ? Family history of cervical cancer   ? Family history of lung cancer   ? Pressure in head   ? ? ?PAST SURGICAL HISTORY: ?Past Surgical History:  ?Procedure Laterality Date  ? BREAST LUMPECTOMY WITH RADIOACTIVE SEED LOCALIZATION Left 07/15/2019  ? Procedure: LEFT BREAST LUMPECTOMY WITH RADIOACTIVE SEED LOCALIZATION;  Surgeon: Angel Klein, MD;  Location: [place];  Service: General;  Laterality: Left;  ? ? ?FAMILY HISTORY: ?Family History  ?Problem Relation Age of Onset  ? Sarcoidosis Mother   ? Lung cancer Father 14  ?     Smoker  ? Breast cancer Maternal Grandfather 3  ? Cervical cancer Maternal Aunt 68  ?The patient's father died at age 2.  He had renal cell carcinoma.Marland Kitchen He was adopted, so she doesn't know anything about his family. He also had a history of lung cancer and was a heavy smoker. Patient's mother is also living at age 61.  She currently resides in [place] burn.  The patient's maternal grandfather was diagnosed with breast cancer.  The patient has 3 siblings, 2 sisters and 1 brother. Unfortunately, her brother died in a car accident when he  was 24. ? ? ?GYNECOLOGIC HISTORY:  ?No LMP recorded. Patient is postmenopausal. ?Menarche: 66 years old ?GX P 0 ?LMP around age 3 ?Contraceptive: taken for 2 years with no complications ?HRT: no   ?Hysterectomy? no ?BSO? no ? ? 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  Her adopted daughter, Angel Gaines. age 32, will graduate from [employer] with a major in teaching May 2022.  ?  ? ADVANCED DIRECTIVES: Her sister, Angel Gaines, is her HCPOA.  Angel Gaines can be reached at [phone] ? ? ?HEALTH MAINTENANCE: ?Social History  ? ?Tobacco Use  ? Smoking status: Never  ? Smokeless tobacco: Never  ?Vaping Use  ? Vaping Use: Never used  ?Substance Use Topics  ? Alcohol use: Yes  ?  Alcohol/week: 2.0 standard drinks  ?  Types: 2 Glasses of wine per week  ? Drug use: No  ? ? ? Colonoscopy: due 2021 ? PAP: 04/30/2019, negative ? Bone density: 2007, -1.1 (osteopenia) ?  ?No Known Allergies ? ?Current Outpatient Medications  ?Medication Sig Dispense Refill  ? Cholecalciferol (VITAMIN D) 2000 units CAPS Take 2,000 Units by mouth daily.    ? Multiple Vitamin (MULTIVITAMIN) tablet Take 1 tablet by mouth daily.    ? Omega-3 Fatty Acids (FISH OIL PO) Take by mouth.    ? tamoxifen (NOLVADEX) 10 MG tablet Take 1 tablet (10 mg total) by mouth 2 (two) times daily. 90 tablet 4  ? tamoxifen (NOLVADEX) 10 MG tablet TAKE 1 TABLET DAILY 90 tablet 3  ? ?No current facility-administered medications for this visit.  ? ? ?OBJECTIVE: Middle-aged white woman who appears well ? ?Vitals:  ? 03/19/22 1333  ?BP: (!) 147/76  ?Pulse: 79  ?Resp: 18  ?Temp: 97.9 ?F (36.6 ?C)  ?SpO2: 100%  ? ? ?   Body mass index is 26.58 kg/m?.   ?Wt Readings from Last 3 Encounters:  ?03/19/22 145 lb 4.8 oz (65.9 kg)  ?10/15/21 158 lb 8 oz (71.9 kg)  ?06/07/21 156 lb 1.6 oz (70.8 kg)  ? ?  ECOG FS:0 - Asymptomatic ? ?Physical Exam ?Constitutional:   ?   Appearance: Normal appearance.  ?Cardiovascular:  ?   Rate and Rhythm: Normal rate and regular rhythm.  ?Chest:  ?   Comments: Bilateral breasts inspected, no palpable masses or regional  adenopathy ?Abdominal:  ?   General: Abdomen is flat. Bowel sounds are normal.  ?   Palpations: There is no mass.  ?Musculoskeletal:     ?   General: Normal range of motion.  ?   Cervical back: Normal range of motion and neck supple. No rigidity.  ?Lymphadenopathy:  ?   Cervical: No cervical adenopathy.  ?Skin: ?   General: Skin is warm and dry.  ?Neurological:  ?   Mental Status: She is alert.  ? ? ? ? ?LAB RESULTS: ? ?CMP  ?   ?Component Value Date/Time  ? NA 140 06/07/2021 1641  ? K 4.1 06/07/2021 1641  ? CL 106 06/07/2021 1641  ? CO2 25 06/07/2021 1641  ? GLUCOSE 103 (H) 06/07/2021 1641  ? BUN 22 06/07/2021 1641  ? CREATININE 0.86 06/07/2021 1641  ? CALCIUM 9.7 06/07/2021 1641  ? PROT 7.4 06/07/2021 1641  ? ALBUMIN 4.5 06/07/2021 1641  ? AST 26 06/07/2021 1641  ? ALT 23 06/07/2021 1641  ? ALKPHOS 73 06/07/2021 1641  ? BILITOT 0.5 06/07/2021 1641  ?  GFRNONAA >60 06/07/2021 1641  ? ? ?Lab Results  ?Component Value Date  ? WBC 6.9 03/19/2022  ? NEUTROABS 4.3 03/19/2022  ? HGB 13.6 03/19/2022  ? HCT 40.4 03/19/2022  ? MCV 88.8 03/19/2022  ? PLT 122 (L) 03/19/2022  ? ? ?No results found for: LABCA2 ? ?No components found for: [id number] ? ?No results for input(s): INR in the last 168 hours. ? ?No results found for: LABCA2 ? ?No results found for: EHM094 ? ?No results found for: BSJ628 ? ?No results found for: ZMO294 ? ?No results found for: CA2729 ? ?No components found for: HGQUANT ? ?No results found for: CEA1 / No results found for: CEA1  ? ?No results found for: AFPTUMOR ? ?No results found for: [place] ? ?No results found for: TOTALPROTELP, ALBUMINELP, A1GS, A2GS, BETS, BETA2SER, GAMS, MSPIKE, SPEI ?(this displays SPEP labs) ? ?No results found for: KPAFRELGTCHN, LAMBDASER, KAPLAMBRATIO ?(kappa/lambda light chains) ? ?No results found for: HGBA, HGBA2QUANT, HGBFQUANT, HGBSQUAN ?(Hemoglobinopathy evaluation)  ? ?No results found for: LDH ? ?No results found for: IRON, TIBC, IRONPCTSAT ?(Iron and TIBC) ? ?No  results found for: FERRITIN ? ?Urinalysis ?No results found for: COLORURINE, APPEARANCEUR, LABSPEC, [place], GLUCOSEU, HGBUR, BILIRUBINUR, KETONESUR, PROTEINUR, UROBILINOGEN, NITRITE, LEUKOCYTESUR ? ? ?STUDIES: ?No results

## 2022-09-19 ENCOUNTER — Ambulatory Visit: Payer: Managed Care, Other (non HMO) | Admitting: Hematology and Oncology

## 2023-07-10 ENCOUNTER — Other Ambulatory Visit: Payer: Self-pay | Admitting: *Deleted

## 2023-07-10 MED ORDER — TAMOXIFEN CITRATE 10 MG PO TABS: 10.0000 mg | ORAL_TABLET | Freq: Every day | ORAL | 3 refills | Status: AC

## 2023-08-27 DIAGNOSIS — I10 Essential (primary) hypertension: Secondary | ICD-10-CM | POA: Diagnosis not present

## 2023-08-27 DIAGNOSIS — Z13220 Encounter for screening for lipoid disorders: Secondary | ICD-10-CM | POA: Diagnosis not present

## 2023-08-27 DIAGNOSIS — Z1211 Encounter for screening for malignant neoplasm of colon: Secondary | ICD-10-CM | POA: Diagnosis not present

## 2023-08-27 DIAGNOSIS — D696 Thrombocytopenia, unspecified: Secondary | ICD-10-CM | POA: Diagnosis not present

## 2023-08-27 DIAGNOSIS — D0512 Intraductal carcinoma in situ of left breast: Secondary | ICD-10-CM | POA: Diagnosis not present

## 2023-08-27 DIAGNOSIS — M8588 Other specified disorders of bone density and structure, other site: Secondary | ICD-10-CM | POA: Diagnosis not present

## 2023-08-27 DIAGNOSIS — Z0000 Encounter for general adult medical examination without abnormal findings: Secondary | ICD-10-CM | POA: Diagnosis not present

## 2023-08-27 DIAGNOSIS — E559 Vitamin D deficiency, unspecified: Secondary | ICD-10-CM | POA: Diagnosis not present

## 2023-08-27 DIAGNOSIS — Z136 Encounter for screening for cardiovascular disorders: Secondary | ICD-10-CM | POA: Diagnosis not present

## 2023-08-27 DIAGNOSIS — R42 Dizziness and giddiness: Secondary | ICD-10-CM | POA: Diagnosis not present

## 2023-09-24 DIAGNOSIS — M76821 Posterior tibial tendinitis, right leg: Secondary | ICD-10-CM | POA: Diagnosis not present

## 2023-09-24 DIAGNOSIS — R899 Unspecified abnormal finding in specimens from other organs, systems and tissues: Secondary | ICD-10-CM | POA: Diagnosis not present

## 2023-09-24 DIAGNOSIS — I10 Essential (primary) hypertension: Secondary | ICD-10-CM | POA: Diagnosis not present

## 2023-10-06 DIAGNOSIS — M76821 Posterior tibial tendinitis, right leg: Secondary | ICD-10-CM | POA: Diagnosis not present

## 2023-10-21 DIAGNOSIS — M7670 Peroneal tendinitis, unspecified leg: Secondary | ICD-10-CM | POA: Diagnosis not present

## 2023-10-24 ENCOUNTER — Encounter: Payer: Self-pay | Admitting: Internal Medicine

## 2023-10-27 DIAGNOSIS — M7670 Peroneal tendinitis, unspecified leg: Secondary | ICD-10-CM | POA: Diagnosis not present

## 2023-10-30 DIAGNOSIS — M7670 Peroneal tendinitis, unspecified leg: Secondary | ICD-10-CM | POA: Diagnosis not present

## 2023-11-03 DIAGNOSIS — M7670 Peroneal tendinitis, unspecified leg: Secondary | ICD-10-CM | POA: Diagnosis not present

## 2023-11-05 DIAGNOSIS — M7670 Peroneal tendinitis, unspecified leg: Secondary | ICD-10-CM | POA: Diagnosis not present

## 2023-11-11 DIAGNOSIS — M7670 Peroneal tendinitis, unspecified leg: Secondary | ICD-10-CM | POA: Diagnosis not present

## 2023-11-14 DIAGNOSIS — M7670 Peroneal tendinitis, unspecified leg: Secondary | ICD-10-CM | POA: Diagnosis not present

## 2023-11-17 DIAGNOSIS — M76821 Posterior tibial tendinitis, right leg: Secondary | ICD-10-CM | POA: Diagnosis not present

## 2023-11-17 DIAGNOSIS — M216X1 Other acquired deformities of right foot: Secondary | ICD-10-CM | POA: Diagnosis not present

## 2023-11-19 ENCOUNTER — Ambulatory Visit (AMBULATORY_SURGERY_CENTER): Payer: Self-pay

## 2023-11-19 VITALS — Ht 63.0 in | Wt 150.0 lb

## 2023-11-19 DIAGNOSIS — Z1211 Encounter for screening for malignant neoplasm of colon: Secondary | ICD-10-CM

## 2023-11-19 MED ORDER — NA SULFATE-K SULFATE-MG SULF 17.5-3.13-1.6 GM/177ML PO SOLN: 1.0000 | Freq: Once | ORAL | 0 refills | Status: AC

## 2023-11-19 NOTE — Progress Notes (Signed)

## 2023-11-20 DIAGNOSIS — M7671 Peroneal tendinitis, right leg: Secondary | ICD-10-CM | POA: Diagnosis not present

## 2023-11-24 DIAGNOSIS — M7670 Peroneal tendinitis, unspecified leg: Secondary | ICD-10-CM | POA: Diagnosis not present

## 2023-11-27 DIAGNOSIS — M7670 Peroneal tendinitis, unspecified leg: Secondary | ICD-10-CM | POA: Diagnosis not present

## 2023-12-05 DIAGNOSIS — M7670 Peroneal tendinitis, unspecified leg: Secondary | ICD-10-CM | POA: Diagnosis not present

## 2023-12-09 DIAGNOSIS — M7670 Peroneal tendinitis, unspecified leg: Secondary | ICD-10-CM | POA: Diagnosis not present

## 2023-12-09 DIAGNOSIS — M76829 Posterior tibial tendinitis, unspecified leg: Secondary | ICD-10-CM | POA: Diagnosis not present

## 2023-12-15 ENCOUNTER — Encounter: Payer: Self-pay | Admitting: Internal Medicine

## 2023-12-16 ENCOUNTER — Encounter: Payer: Self-pay | Admitting: Internal Medicine

## 2023-12-16 ENCOUNTER — Ambulatory Visit (AMBULATORY_SURGERY_CENTER): Payer: Medicare Other | Admitting: Internal Medicine

## 2023-12-16 VITALS — BP 108/60 | HR 80 | Temp 97.5°F | Resp 13 | Ht 63.0 in | Wt 150.0 lb

## 2023-12-16 DIAGNOSIS — Z1211 Encounter for screening for malignant neoplasm of colon: Secondary | ICD-10-CM

## 2023-12-16 MED ORDER — SODIUM CHLORIDE 0.9 % IV SOLN: 500.0000 mL | Freq: Once | INTRAVENOUS | Status: DC

## 2023-12-16 NOTE — Progress Notes (Signed)
 HISTORY OF PRESENT ILLNESS:  Angel Gaines is a 68 y.o. female seen today for screening colonoscopy.  REVIEW OF SYSTEMS:  All non-GI ROS negative except for  Past Medical History:  Diagnosis Date   Cancer (HCC)    Family history of breast cancer in female    Family history of cervical cancer    Family history of lung cancer    GERD (gastroesophageal reflux disease)    Hypertension    Pressure in head     Past Surgical History:  Procedure Laterality Date   BREAST LUMPECTOMY  2020   Left   BREAST LUMPECTOMY WITH RADIOACTIVE SEED LOCALIZATION Left 07/15/2019   Procedure: LEFT BREAST LUMPECTOMY WITH RADIOACTIVE SEED LOCALIZATION;  Surgeon: Aron Shoulders, MD;  Location: [facility];  Service: General;  Laterality: Left;    Social History Angel Gaines  reports that she has never smoked. She has never used smokeless tobacco. She reports current alcohol use of about 2.0 standard drinks of alcohol per week. She reports that she does not use drugs.  family history includes Breast cancer (age of onset: 31) in her maternal grandfather; Cervical cancer (age of onset: 33) in her maternal aunt; Lung cancer (age of onset: 86) in her father; Sarcoidosis in her mother.  No Known Allergies     PHYSICAL EXAMINATION: Vital signs: BP (!) 157/77   Pulse 72   Temp (!) 97.5 F (36.4 C) (Temporal)   Ht 5' 3 (1.6 m)   Wt 150 lb (68 kg)   SpO2 99%   BMI 26.57 kg/m  General: Well-developed, well-nourished, no acute distress HEENT: Sclerae are anicteric, conjunctiva pink. Oral mucosa intact Lungs: Clear Heart: Regular Abdomen: soft, nontender, nondistended, no obvious ascites, no peritoneal signs, normal bowel sounds. No organomegaly. Extremities: No edema Psychiatric: alert and oriented x3. Cooperative     ASSESSMENT:  Colon cancer screening  PLAN:   Screening colonoscopy

## 2023-12-16 NOTE — Progress Notes (Signed)
 Pt's states no medical or surgical changes since previsit or office visit.

## 2023-12-16 NOTE — Patient Instructions (Signed)
  Resume previous diet  Continue present medications  REPEAT COLONOSCOPY IN 10 YEARS FOR SCREENING PURPOSES   YOU HAD AN ENDOSCOPIC PROCEDURE TODAY AT THE [facility]:   Refer to the procedure report that was given to you for any specific questions about what was found during the examination.  If the procedure report does not answer your questions, please call your gastroenterologist to clarify.  If you requested that your care partner not be given the details of your procedure findings, then the procedure report has been included in a sealed envelope for you to review at your convenience later.  YOU SHOULD EXPECT: Some feelings of bloating in the abdomen. Passage of more gas than usual.  Walking can help get rid of the air that was put into your GI tract during the procedure and reduce the bloating. If you had a lower endoscopy (such as a colonoscopy or flexible sigmoidoscopy) you may notice spotting of blood in your stool or on the toilet paper. If you underwent a bowel prep for your procedure, you may not have a normal bowel movement for a few days.  Please Note:  You might notice some irritation and congestion in your nose or some drainage.  This is from the oxygen used during your procedure.  There is no need for concern and it should clear up in a day or so.  SYMPTOMS TO REPORT IMMEDIATELY:  Following lower endoscopy (colonoscopy or flexible sigmoidoscopy):  Excessive amounts of blood in the stool  Significant tenderness or worsening of abdominal pains  Swelling of the abdomen that is new, acute  Fever of 100F or higher  For urgent or emergent issues, a gastroenterologist can be reached at any hour by calling [phone]. Do not use MyChart messaging for urgent concerns.    DIET:  We do recommend a small meal at first, but then you may proceed to your regular diet.  Drink plenty of fluids but you should avoid alcoholic beverages for 24 hours.  ACTIVITY:  You should  plan to take it easy for the rest of today and you should NOT DRIVE or use heavy machinery until tomorrow (because of the sedation medicines used during the test).    FOLLOW UP: Our staff will call the number listed on your records the next business day following your procedure.  We will call around 7:15- 8:00 am to check on you and address any questions or concerns that you may have regarding the information given to you following your procedure. If we do not reach you, we will leave a message.     If any biopsies were taken you will be contacted by phone or by letter within the next 1-3 weeks.  Please call us at [phone] if you have not heard about the biopsies in 3 weeks.    SIGNATURES/CONFIDENTIALITY: You and/or your care partner have signed paperwork which will be entered into your electronic medical record.  These signatures attest to the fact that that the information above on your After Visit Summary has been reviewed and is understood.  Full responsibility of the confidentiality of this discharge information lies with you and/or your care-partner.

## 2023-12-16 NOTE — Progress Notes (Signed)
 To pacu, VSS. Report to [email or web address]

## 2023-12-16 NOTE — Op Note (Signed)
 [facility] Patient Name: Angel Gaines Procedure Date: 12/16/2023 11:27 AM MRN: [id number] Endoscopist: Norleen SAILOR. Abran , MD, [id number] Age: 68 Referring MD:  Date of Birth: 26-May-1956 Gender: Female Account #: [id number] Procedure:                Colonoscopy Indications:              Screening for colorectal malignant neoplasm.                            Reports negative index exam elsewhere 13 years ago.                            Negative Cologuard 3 years ago. Medicines:                Monitored Anesthesia Care Procedure:                Pre-Anesthesia Assessment:                           - Prior to the procedure, a History and Physical                            was performed, and patient medications and                            allergies were reviewed. The patient's tolerance of                            previous anesthesia was also reviewed. The risks                            and benefits of the procedure and the sedation                            options and risks were discussed with the patient.                            All questions were answered, and informed consent                            was obtained. Prior Anticoagulants: The patient has                            taken no anticoagulant or antiplatelet agents. ASA                            Grade Assessment: II - A patient with mild systemic                            disease. After reviewing the risks and benefits,                            the patient was deemed in satisfactory condition to  undergo the procedure.                           After obtaining informed consent, the colonoscope                            was passed under direct vision. Throughout the                            procedure, the patient's blood pressure, pulse, and                            oxygen saturations were monitored continuously. The                            Olympus Scope [id number] was  introduced through the                            anus and advanced to the the cecum, identified by                            appendiceal orifice and ileocecal valve. The                            ileocecal valve, appendiceal orifice, and rectum                            were photographed. The quality of the bowel                            preparation was excellent. The colonoscopy was                            performed without difficulty. The patient tolerated                            the procedure well. The bowel preparation used was                            SUPREP via split dose instruction. Scope In: 11:39:49 AM Scope Out: 11:49:36 AM Scope Withdrawal Time: 0 hours 7 minutes 15 seconds  Total Procedure Duration: 0 hours 9 minutes 47 seconds  Findings:                 The entire examined colon appeared normal on direct                            and retroflexion views. Complications:            No immediate complications. Estimated blood loss:                            None. Estimated Blood Loss:     Estimated blood loss: none. Impression:               - The entire examined colon  is normal on direct and                            retroflexion views.                           - No specimens collected. Recommendation:           - Repeat colonoscopy in 10 years for screening                            purposes.                           - Patient has a contact number available for                            emergencies. The signs and symptoms of potential                            delayed complications were discussed with the                            patient. Return to normal activities tomorrow.                            Written discharge instructions were provided to the                            patient.                           - Resume previous diet.                           - Continue present medications. Norleen SAILOR. Abran, MD 12/16/2023 11:53:47 AM This report has  been signed electronically.

## 2023-12-17 ENCOUNTER — Telehealth: Payer: Self-pay | Admitting: *Deleted

## 2023-12-17 NOTE — Telephone Encounter (Signed)
 Called patient to follow up after 12/16/23 procedure. Left voicemail with callback number for any questions or concerns.

## 2024-01-06 DIAGNOSIS — M19071 Primary osteoarthritis, right ankle and foot: Secondary | ICD-10-CM | POA: Diagnosis not present

## 2024-01-06 DIAGNOSIS — M76821 Posterior tibial tendinitis, right leg: Secondary | ICD-10-CM | POA: Diagnosis not present

## 2024-01-26 DIAGNOSIS — M76821 Posterior tibial tendinitis, right leg: Secondary | ICD-10-CM | POA: Diagnosis not present

## 2024-02-11 DIAGNOSIS — M76821 Posterior tibial tendinitis, right leg: Secondary | ICD-10-CM | POA: Diagnosis not present

## 2024-03-10 DIAGNOSIS — Z1231 Encounter for screening mammogram for malignant neoplasm of breast: Secondary | ICD-10-CM | POA: Diagnosis not present

## 2024-03-24 DIAGNOSIS — I10 Essential (primary) hypertension: Secondary | ICD-10-CM | POA: Diagnosis not present

## 2024-05-06 ENCOUNTER — Ambulatory Visit: Admitting: Dermatology

## 2024-05-06 ENCOUNTER — Encounter: Payer: Self-pay | Admitting: Dermatology

## 2024-05-06 DIAGNOSIS — L821 Other seborrheic keratosis: Secondary | ICD-10-CM

## 2024-05-06 DIAGNOSIS — D492 Neoplasm of unspecified behavior of bone, soft tissue, and skin: Secondary | ICD-10-CM | POA: Diagnosis not present

## 2024-05-06 DIAGNOSIS — D1801 Hemangioma of skin and subcutaneous tissue: Secondary | ICD-10-CM

## 2024-05-06 DIAGNOSIS — D489 Neoplasm of uncertain behavior, unspecified: Secondary | ICD-10-CM

## 2024-05-06 DIAGNOSIS — L814 Other melanin hyperpigmentation: Secondary | ICD-10-CM | POA: Diagnosis not present

## 2024-05-06 NOTE — Progress Notes (Signed)
   Follow-Up Visit   Subjective  Angel Gaines is a 68 y.o. female who presents for the following: spot check on the right arm. Lesion has been there for 3 months, with changes in size and color; not previously treated.  The patient has spots, moles and lesions to be evaluated.  The following portions of the chart were reviewed this encounter and updated as appropriate: medications, allergies, medical history  Review of Systems:  No other skin or systemic complaints except as noted in HPI or Assessment and Plan.  Objective  Well appearing patient in no apparent distress; mood and affect are within normal limits.   A focused examination was performed of the following areas: Right arm and upper back  Relevant exam findings are noted in the Assessment and Plan.  Right Forearm - Posterior 1.0cm x 1.2cm pink plaque   Assessment & Plan   SEBORRHEIC KERATOSIS back - Stuck-on, waxy, tan-brown papules and/or plaques  - Benign-appearing - Discussed benign etiology and prognosis. - Observe - Call for any changes  LENTIGINES- upper back Exam: scattered tan macules Due to sun exposure Treatment Plan: Benign-appearing, observe. Recommend daily broad spectrum sunscreen SPF 30+ to sun-exposed areas, reapply every 2 hours as needed.  Call for any changes  NEOPLASM OF UNCERTAIN BEHAVIOR Right Forearm - Posterior Skin / nail biopsy Type of biopsy: tangential   Informed consent: discussed and consent obtained   Timeout: patient name, date of birth, surgical site, and procedure verified   Procedure prep:  Patient was prepped and draped in usual sterile fashion Prep type:  Isopropyl alcohol Anesthesia: the lesion was anesthetized in a standard fashion   Anesthetic:  1% lidocaine  w/ epinephrine  1-100,000 buffered w/ 8.4% NaHCO3 Instrument used: DermaBlade   Hemostasis achieved with: aluminum chloride   Outcome: patient tolerated procedure well   Post-procedure details: sterile  dressing applied and wound care instructions given   Dressing type: bandage and petrolatum   SEBORRHEIC KERATOSES   LENTIGINES    Return in about 1 month (around 06/06/2024) for Full body skin exam.  I, Maryan Smalling, RN, am acting as scribe for Deneise Finlay, MD .   Documentation: I have reviewed the above documentation for accuracy and completeness, and I agree with the above.  Deneise Finlay, MD

## 2024-05-06 NOTE — Patient Instructions (Signed)

## 2024-05-10 ENCOUNTER — Ambulatory Visit: Payer: Self-pay | Admitting: Dermatology

## 2024-05-10 LAB — SURGICAL PATHOLOGY

## 2024-06-08 ENCOUNTER — Ambulatory Visit: Admitting: Dermatology

## 2024-06-08 ENCOUNTER — Encounter: Payer: Self-pay | Admitting: Dermatology

## 2024-06-08 VITALS — BP 118/69 | HR 74

## 2024-06-08 DIAGNOSIS — D1801 Hemangioma of skin and subcutaneous tissue: Secondary | ICD-10-CM

## 2024-06-08 DIAGNOSIS — L578 Other skin changes due to chronic exposure to nonionizing radiation: Secondary | ICD-10-CM | POA: Diagnosis not present

## 2024-06-08 DIAGNOSIS — W908XXA Exposure to other nonionizing radiation, initial encounter: Secondary | ICD-10-CM

## 2024-06-08 DIAGNOSIS — D229 Melanocytic nevi, unspecified: Secondary | ICD-10-CM

## 2024-06-08 DIAGNOSIS — L814 Other melanin hyperpigmentation: Secondary | ICD-10-CM | POA: Diagnosis not present

## 2024-06-08 DIAGNOSIS — L570 Actinic keratosis: Secondary | ICD-10-CM

## 2024-06-08 DIAGNOSIS — D492 Neoplasm of unspecified behavior of bone, soft tissue, and skin: Secondary | ICD-10-CM

## 2024-06-08 DIAGNOSIS — L821 Other seborrheic keratosis: Secondary | ICD-10-CM | POA: Diagnosis not present

## 2024-06-08 DIAGNOSIS — L820 Inflamed seborrheic keratosis: Secondary | ICD-10-CM

## 2024-06-08 DIAGNOSIS — D485 Neoplasm of uncertain behavior of skin: Secondary | ICD-10-CM

## 2024-06-08 DIAGNOSIS — Z1283 Encounter for screening for malignant neoplasm of skin: Secondary | ICD-10-CM

## 2024-06-08 DIAGNOSIS — Z872 Personal history of diseases of the skin and subcutaneous tissue: Secondary | ICD-10-CM | POA: Diagnosis not present

## 2024-06-08 NOTE — Progress Notes (Signed)
 Follow-Up Visit   Subjective  Angel Gaines is a 68 y.o. female who presents for the following: Skin Cancer Screening and Full Body Skin Exam  The patient presents for Total-Body Skin Exam (TBSE) for skin cancer screening and mole check. The patient has spots, moles and lesions to be evaluated, some may be new or changing.  The following portions of the chart were reviewed this encounter and updated as appropriate: medications, allergies, medical history  Review of Systems:  No other skin or systemic complaints except as noted in HPI or Assessment and Plan.  Objective  Well appearing patient in no apparent distress; mood and affect are within normal limits.  A full examination was performed including scalp, head, eyes, ears, nose, lips, neck, chest, axillae, abdomen, back, buttocks, bilateral upper extremities, bilateral lower extremities, hands, feet, fingers, toes, fingernails, and toenails. All findings within normal limits unless otherwise noted below.   Relevant physical exam findings are noted in the Assessment and Plan.  Mid Back 5mm pink irritated papule   Assessment & Plan   SKIN CANCER SCREENING PERFORMED TODAY.  ACTINIC DAMAGE - Chronic condition, secondary to cumulative UV/sun exposure - diffuse scaly erythematous macules with underlying dyspigmentation - Recommend daily broad spectrum sunscreen SPF 30+ to sun-exposed areas, reapply every 2 hours as needed.  - Staying in the shade or wearing long sleeves, sun glasses (UVA+UVB protection) and wide brim hats (4-inch brim around the entire circumference of the hat) are also recommended for sun protection.  - Call for new or changing lesions.  LENTIGINES, SEBORRHEIC KERATOSES, HEMANGIOMAS - Benign normal skin lesions - Benign-appearing - Call for any changes  MELANOCYTIC NEVI - Tan-brown and/or pink-flesh-colored symmetric macules and papules - Benign appearing on exam today - Observation - Call clinic for new  or changing moles - Recommend daily use of broad spectrum spf 30+ sunscreen to sun-exposed areas.   HISTORY OF PRECANCEROUS ACTINIC KERATOSIS - site(s) of PreCancerous Actinic Keratosis clear today. - these may recur and new lesions may form requiring treatment to prevent transformation into skin cancer - observe for new or changing spots and contact  [facility] for appointment if occur - photoprotection with sun protective clothing; sunglasses and broad spectrum sunscreen with SPF of at least 30 + and frequent self skin exams recommended - yearly exams by a dermatologist recommended for persons with history of PreCancerous Actinic Keratoses   NEOPLASM OF UNCERTAIN BEHAVIOR OF SKIN Mid Back Skin / nail biopsy Type of biopsy: tangential   Informed consent: discussed and consent obtained   Timeout: patient name, date of birth, surgical site, and procedure verified   Procedure prep:  Patient was prepped and draped in usual sterile fashion Prep type:  Isopropyl alcohol Anesthesia: the lesion was anesthetized in a standard fashion   Anesthetic:  1% lidocaine  w/ epinephrine  1-100,000 buffered w/ 8.4% NaHCO3 Instrument used: DermaBlade   Hemostasis achieved with: aluminum chloride   Outcome: patient tolerated procedure well   Post-procedure details: sterile dressing applied and wound care instructions given   Dressing type: petrolatum gauze and bandage   Specimen 1 - Surgical pathology Differential Diagnosis: r/o nmsc vs other  Check Margins: No SEBORRHEIC KERATOSES   LENTIGINES   CHERRY ANGIOMA   ACTINIC KERATOSES   MULTIPLE BENIGN NEVI   ACTINIC SKIN DAMAGE   Return in about 1 year (around 06/08/2025) for TBSC.  I, Berwyn Lesches, Surg Tech III, am acting as scribe for RUFUS CHRISTELLA HOLY, MD.   Documentation: I have  reviewed the above documentation for accuracy and completeness, and I agree with the above.  RUFUS CHRISTELLA HOLY, MD

## 2024-06-08 NOTE — Patient Instructions (Signed)

## 2024-06-09 ENCOUNTER — Ambulatory Visit: Payer: Self-pay | Admitting: Dermatology

## 2024-06-09 LAB — SURGICAL PATHOLOGY

## 2024-08-31 DIAGNOSIS — R0781 Pleurodynia: Secondary | ICD-10-CM | POA: Diagnosis not present

## 2024-08-31 DIAGNOSIS — D696 Thrombocytopenia, unspecified: Secondary | ICD-10-CM | POA: Diagnosis not present

## 2024-08-31 DIAGNOSIS — D0512 Intraductal carcinoma in situ of left breast: Secondary | ICD-10-CM | POA: Diagnosis not present

## 2024-08-31 DIAGNOSIS — I10 Essential (primary) hypertension: Secondary | ICD-10-CM | POA: Diagnosis not present

## 2024-08-31 DIAGNOSIS — Z0000 Encounter for general adult medical examination without abnormal findings: Secondary | ICD-10-CM | POA: Diagnosis not present

## 2024-08-31 DIAGNOSIS — M76821 Posterior tibial tendinitis, right leg: Secondary | ICD-10-CM | POA: Diagnosis not present

## 2024-08-31 DIAGNOSIS — M8588 Other specified disorders of bone density and structure, other site: Secondary | ICD-10-CM | POA: Diagnosis not present

## 2024-09-01 DIAGNOSIS — R0781 Pleurodynia: Secondary | ICD-10-CM | POA: Diagnosis not present

## 2024-09-06 DIAGNOSIS — M7061 Trochanteric bursitis, right hip: Secondary | ICD-10-CM | POA: Diagnosis not present

## 2024-09-06 DIAGNOSIS — M7671 Peroneal tendinitis, right leg: Secondary | ICD-10-CM | POA: Diagnosis not present

## 2024-09-13 DIAGNOSIS — M25571 Pain in right ankle and joints of right foot: Secondary | ICD-10-CM | POA: Diagnosis not present

## 2024-09-13 DIAGNOSIS — M799 Soft tissue disorder, unspecified: Secondary | ICD-10-CM | POA: Diagnosis not present

## 2024-09-13 DIAGNOSIS — M25551 Pain in right hip: Secondary | ICD-10-CM | POA: Diagnosis not present

## 2024-09-13 DIAGNOSIS — M6281 Muscle weakness (generalized): Secondary | ICD-10-CM | POA: Diagnosis not present

## 2024-09-15 DIAGNOSIS — M6281 Muscle weakness (generalized): Secondary | ICD-10-CM | POA: Diagnosis not present

## 2024-09-15 DIAGNOSIS — M25571 Pain in right ankle and joints of right foot: Secondary | ICD-10-CM | POA: Diagnosis not present

## 2024-09-15 DIAGNOSIS — M799 Soft tissue disorder, unspecified: Secondary | ICD-10-CM | POA: Diagnosis not present

## 2024-09-15 DIAGNOSIS — M25551 Pain in right hip: Secondary | ICD-10-CM | POA: Diagnosis not present

## 2024-09-21 DIAGNOSIS — M25551 Pain in right hip: Secondary | ICD-10-CM | POA: Diagnosis not present

## 2024-09-21 DIAGNOSIS — M6281 Muscle weakness (generalized): Secondary | ICD-10-CM | POA: Diagnosis not present

## 2024-09-21 DIAGNOSIS — M25571 Pain in right ankle and joints of right foot: Secondary | ICD-10-CM | POA: Diagnosis not present

## 2024-09-21 DIAGNOSIS — M799 Soft tissue disorder, unspecified: Secondary | ICD-10-CM | POA: Diagnosis not present

## 2024-09-24 DIAGNOSIS — M25551 Pain in right hip: Secondary | ICD-10-CM | POA: Diagnosis not present

## 2024-09-24 DIAGNOSIS — M6281 Muscle weakness (generalized): Secondary | ICD-10-CM | POA: Diagnosis not present

## 2024-09-24 DIAGNOSIS — M799 Soft tissue disorder, unspecified: Secondary | ICD-10-CM | POA: Diagnosis not present

## 2024-09-24 DIAGNOSIS — M25571 Pain in right ankle and joints of right foot: Secondary | ICD-10-CM | POA: Diagnosis not present

## 2024-09-30 DIAGNOSIS — M25551 Pain in right hip: Secondary | ICD-10-CM | POA: Diagnosis not present

## 2024-09-30 DIAGNOSIS — M25571 Pain in right ankle and joints of right foot: Secondary | ICD-10-CM | POA: Diagnosis not present

## 2024-09-30 DIAGNOSIS — M6281 Muscle weakness (generalized): Secondary | ICD-10-CM | POA: Diagnosis not present

## 2024-09-30 DIAGNOSIS — M799 Soft tissue disorder, unspecified: Secondary | ICD-10-CM | POA: Diagnosis not present

## 2024-10-04 DIAGNOSIS — M799 Soft tissue disorder, unspecified: Secondary | ICD-10-CM | POA: Diagnosis not present

## 2024-10-04 DIAGNOSIS — M6281 Muscle weakness (generalized): Secondary | ICD-10-CM | POA: Diagnosis not present

## 2024-10-04 DIAGNOSIS — M25551 Pain in right hip: Secondary | ICD-10-CM | POA: Diagnosis not present

## 2024-10-04 DIAGNOSIS — M25571 Pain in right ankle and joints of right foot: Secondary | ICD-10-CM | POA: Diagnosis not present

## 2025-06-28 ENCOUNTER — Ambulatory Visit: Admitting: Dermatology
